# Patient Record
Sex: Female | Born: 1945 | Race: White | Hispanic: No | Marital: Married | Smoking: Current every day smoker
Health system: Southern US, Community
[De-identification: ages and names within clinical notes are randomized; demographics above are authoritative.]

## PROBLEM LIST (undated history)

## (undated) DIAGNOSIS — E7800 Pure hypercholesterolemia, unspecified: Secondary | ICD-10-CM

## (undated) DIAGNOSIS — E119 Type 2 diabetes mellitus without complications: Secondary | ICD-10-CM

## (undated) DIAGNOSIS — K219 Gastro-esophageal reflux disease without esophagitis: Secondary | ICD-10-CM

## (undated) HISTORY — PX: CYST EXCISION: SHX5701

## (undated) HISTORY — PX: MOUTH SURGERY: SHX715

## (undated) HISTORY — PX: ABDOMINAL HYSTERECTOMY: SHX81

---

## 1998-11-08 ENCOUNTER — Other Ambulatory Visit: Admission: RE | Admit: 1998-11-08 | Discharge: 1998-11-08 | Payer: Self-pay | Admitting: Gynecology

## 1998-12-25 ENCOUNTER — Encounter: Payer: Self-pay | Admitting: Gynecology

## 1998-12-25 ENCOUNTER — Ambulatory Visit (HOSPITAL_COMMUNITY): Admission: RE | Admit: 1998-12-25 | Discharge: 1998-12-25 | Payer: Self-pay | Admitting: Gynecology

## 1999-07-25 ENCOUNTER — Encounter: Payer: Self-pay | Admitting: Gynecology

## 1999-07-26 ENCOUNTER — Encounter (INDEPENDENT_AMBULATORY_CARE_PROVIDER_SITE_OTHER): Payer: Self-pay | Admitting: Specialist

## 1999-07-26 ENCOUNTER — Inpatient Hospital Stay (HOSPITAL_COMMUNITY): Admission: RE | Admit: 1999-07-26 | Discharge: 1999-07-27 | Payer: Self-pay | Admitting: Gynecology

## 2001-01-07 ENCOUNTER — Encounter: Payer: Self-pay | Admitting: Obstetrics and Gynecology

## 2001-01-14 ENCOUNTER — Inpatient Hospital Stay (HOSPITAL_COMMUNITY): Admission: RE | Admit: 2001-01-14 | Discharge: 2001-01-15 | Payer: Self-pay | Admitting: Obstetrics and Gynecology

## 2001-01-14 ENCOUNTER — Encounter (INDEPENDENT_AMBULATORY_CARE_PROVIDER_SITE_OTHER): Payer: Self-pay | Admitting: Specialist

## 2003-06-17 ENCOUNTER — Inpatient Hospital Stay (HOSPITAL_COMMUNITY): Admission: EM | Admit: 2003-06-17 | Discharge: 2003-06-20 | Payer: Self-pay | Admitting: Emergency Medicine

## 2003-06-17 ENCOUNTER — Encounter: Payer: Self-pay | Admitting: Emergency Medicine

## 2007-05-21 ENCOUNTER — Emergency Department (HOSPITAL_COMMUNITY): Admission: EM | Admit: 2007-05-21 | Discharge: 2007-05-21 | Payer: Self-pay | Admitting: Emergency Medicine

## 2011-02-22 NOTE — H&P (Signed)
[facility]  Patient:    Angela Bernard, Angela Bernard                       MRN: [id number] Adm. Date:  01/14/01 Attending:  Debbe Bales A. Edward Jolly, M.D.                         History and Physical  CHIEF COMPLAINT:  Recurrent pelvic prolapse.  HISTORY OF PRESENT ILLNESS:  The patient is a 65 year old gravida 5, para 6, Caucasian female, status post total vaginal hysterectomy with modified McCall culdoplasty and anterior colporrhaphy in October of 2000, who presents with a complaint of recurrent prolapse beginning six to eight months following surgery. The patient reports that she feels like she has a balloon in the vagina. She requires digital pressure in the vagina in order to evacuate stool from the rectum. She denies any fecal incontinence. The patient denies stress incontinence, urinary urgency, or urge incontinence. The patient has undergone urodynamic testing which documents the absence of genuine stress incontinence. The patients cystometrogram was stable, and mild sensory urgency was noted. The patient had adequate voiding. The patient wishes for a surgical evaluation and treatment of the recurrent prolapse.  PAST OBSTETRIC AND GYNECOLOGIC HISTORY:  Remarkable for five prior vaginal deliveries. The patients last Pap smear was performed in 2000 and was normal, as had been all of her previous Pap smears. The patient had her last mammogram performed December 02, 2000, and this was noted to be within normal limits. The patient is currently taking hormone replacement therapy, Premarin 0.625 mg p.o. q.d.  PAST MEDICAL HISTORY:  Tobacco abuse:  One pack per day x 34 years.  PAST SURGICAL HISTORY:  1. Status post total vaginal hysterectomy with modified McCall culdoplasty     and anterior colporrhaphy in October of 2000.  2. Status post cyst removal from the right wrist in 1978.  3. Status post teeth extractions in March of 1975.  MEDICATIONS:  Premarin 0.625 mg p.o.  q.d.  ALLERGIES:  No known drug allergies.  SOCIAL HISTORY:  The patient is married. Her work requires lifting. The patient smokes one pack of cigarettes per day. She denies the use of alcohol or IV or addicting drugs.  FAMILY HISTORY:  Positive for breast cancer in a grandmother. The patients father has both hypertension and had a myocardial infarction.  REVIEW OF SYSTEMS:  Please refer to history of present illness.  PHYSICAL EXAMINATION:  GENERAL:  The patient is a middle-aged appearing Caucasian female in no acute distress.  NECK:  Negative for adenopathy and thyromegaly.  LUNGS:  Clear to auscultation bilaterally.  HEART:  S1, S2 with a regular rate and rhythm and no evidence of a murmur, rub or gallop.  ABDOMEN:  Soft and nontender and without evidence of hepatosplenomegaly or organomegaly.  PELVIC:  Normal external genitalia. Normal urethra. There is evidence of a second degree cystocele. There is first degree apical prolapse and a possible enterocele noted. The cervix is absent. There is a second degree rectocele. The bimanual exam documents the absence of midline and adnexal masses. The rectovaginal examination confirms the bimanual exam, and the stool is noted to be guaiac negative.  ASSESSMENT:  The patient is a 65 year old, gravida 5, para 5, Caucasian female, status post total vaginal hysterectomy with modified McCall culdoplasty and anterior colporrhaphy, now with recurrent pelvic organ prolapse. The patient also has a history of significant tobacco abuse.  PLAN:  For  the patient to undergo an anterior and posterior colporrhaphy with possible enterocele repair, ileococcygeus vaginal vault suspension, cystoscopy, and suprapubic catheter placement at [facility] on January 14, 2001. The risks, benefits, and alternatives have been discussed with the patient and she chooses to proceed. DD:  01/13/01 TD:  01/13/01 Job: [id number]

## 2011-02-22 NOTE — Discharge Summary (Signed)
[facility]  Patient:    Angela Bernard, Angela Bernard             MRN: [id number] Adm. Date:  [id number] Disc. Date: [id number] Attending:  Conley Simmonds A                           Discharge Summary  ADMITTING DIAGNOSES: 1. Recurrent pelvic organ prolapse. 2. Tobacco abuse.  DISCHARGE DIAGNOSES: 1. Recurrent pelvic organ prolapse. 2. Status post interior and posterior colporrhaphy, enterocele repair,    iliococcygeus vaginal vault suspension, cystoscopy, suprapubic catheter    placement.  SIGNIFICANT OPERATIONS AND PROCEDURES:  Anterior and posterior colporrhaphy, enterocele repair, iliococcygeus vaginal vault suspension. cystoscopy, suprapubic catheter placement at [facility] on January 14, 2001.  PERTINENT ADMISSION HISTORY AND PHYSICAL EXAMINATION:  The patient was a 65 year old, gravida 5, para 5, Caucasian female, status post total vaginal hysterectomy with modified McCall culdoplasty and anterior colporrhaphy in October 2000, who presented with recurrent pelvic organ prolapse.  The patient required digital pressure in the vagina in order to evacuate stool from the rectum.  She denied any fecal or urinary incontinence.  The patient did have multiple genitourinary dynamic testing performed which documented a stable cystometrogram and no evidence of genuine stress incontinence but a packing placed inside the vagina.  The patient had adequate voiding.  PAST MEDICAL HISTORY:  Tobacco abuse.  The patient smoked 1 package of cigarettes per day for approximately 34 years.  PHYSICAL EXAMINATION:  PELVIC:  Documented normal external genitalia.  The urethra was normal.  There was evidence of a second-degree cystocele, first-degree apical prolapse, and enterocele, and a second-degree rectocele.  The cervix was absent.  There were no midline nor adnexal masses appreciated.  The stool was noted to be guaiac negative.  HOSPITAL COURSE:  The patient  was admitted on January 14, 2001, at which time she underwent an anterior and posterior colporrhaphy with enterocele repair, iliococcygeus vaginal vault suspension, cystoscopy, and suprapubic catheter placement while under general anesthesia.  The surgery was uncomplicated, and the estimated blood loss was 300 cc.  Postoperatively, the patient had an unremarkable course.  On postoperative day #1, the vaginal packing was removed.  The patient had removal of the Foley catheter, and she began bladder draining.  Initially, the patient was able to have spontaneous voids with high residuals, but she developed bladder spasms, and the spontaneous voids diminished to 0 cc with residuals of approximately 200-300 cc.  The patient remained afebrile during her hospital stay.  Her suprapubic catheter site remained without evidence of any erythema or drainage.  The patient was slowly advanced to a regular diet.  Once the PCA and the Toradol were discontinued, she required no further pain medication.  The patient was able to ambulate independently, and she did have TED hose on and compression stockings while lying in bed.  The patients discharge hematocrit was noted to be 33.3%.  The patient was discharged to home in good condition on January 15, 2001.  The patient expressed interest in trying Zyban to assist in smoking cessation. The patient was given a prescription for Zyban 150 mg p.o. q.d. x 3 days, then 150 mg p.o. b.i.d. x 12 weeks.  The patient was also given a prescription for Percocet 1-2 p.o. q.4-6h. p.r.n. pain.  The patient will have decreased activity for the next three months.  She will undergo bladder rest for the next two days with the suprapubic catheter  placed to drainage.  She will then begin bladder training again.  The patient will call the office in four days to report the results of her bladder training.  She will call if she experiences fever, increased pain, increased bleeding,  painful urination, or blocked suprapubic catheter or any other concern. DD:  01/15/01 TD:  01/16/01 Job: [id number]

## 2011-02-22 NOTE — Op Note (Signed)
[facility]  Patient:    Angela Bernard, Angela Bernard                       MRN: [id number] Proc. Date: 01/14/01 Attending:  Debbe Bales A. Edward Jolly, M.D.                           Operative Report  PREOPERATIVE DIAGNOSIS:  Recurrent pelvic organ prolapse.  POSTOPERATIVE DIAGNOSIS:  Recurrent pelvic organ prolapse.  PROCEDURE:  Anterior and posterior colporrhaphy, iliococcygeus vaginal vault suspension, enterocele repair, cystoscopy with suprapubic catheter placement.  SURGEON:  Brook A. Edward Jolly, M.D.  ASSISTANT:  Miguel Aschoff, M.D.  ANESTHESIA:  General endotracheal.  IV FLUIDS:  2700 cc Ringers Lactate.  ESTIMATED BLOOD LOSS:  300 cc.  URINE OUTPUT:  150 cc.  COMPLICATIONS:  None.  INDICATIONS FOR PROCEDURE:  The patient was a 65 year old, gravida 5, para 45, Caucasian female, status post total vaginal hysterectomy with modified McCall culdoplasty and anterior colporrhaphy in October 2000, who presented with recurrent prolapse of the vagina and a wish for surgical evaluation and treatment.  The patient reported the need for digital pressure in the vagina in order to evacuate stool from the rectum.  She denied any fecal incontinence and urinary incontinence.  The patient did undergo multichannel urodynamic testing prior to her surgery, and there was no evidence of genuine stress incontinence.  The patient chose to proceed with surgical evaluation and treatment of the prolapse after the risks, benefits, and alternatives were discussed with her.  FINDINGS:  Examination under anesthesia revealed an absent cervix and uterus. No adnexal masses were appreciated.  There was evidence of a second-degree cystocele, first-degree apical prolapse, a large enterocele, and a second-degree rectocele.  Cystoscopy at the end of the procedure documented the ureters to be patent bilaterally.  The urethra and bladder were without evidence of any sutures. The trigone region was noted to  be unremarkable, and the bladder was visualized throughout the entire 360 degrees.  PROCEDURE:  With an IV in place and compression stockings and TED hose on, the patient was taken to the operating room after she was properly identified. The patient did receive Ancef 1 gram IV as antibiotic prophylaxis prior to beginning the procedure.  The patient was placed in the supine position, and general endotracheal anesthesia was induced.  The patient was then placed in the dorsal lithotomy position, and the vagina and perineum were sterilely prepped and draped.  A Foley catheter was sterilely placed inside the bladder, and an examination under anesthesia was performed, and the findings are as noted above.  The procedure began by marking the vaginal apices bilaterally with a figure-of-eight suture of Vicryl.  Allis clamps were then used to mark the hymen and perineal body and the midline of the vagina up to the level of the vaginal apex.  The vaginal mucosa was then injected with 1% lidocaine with 1:200,000 of epinephrine for blanching of the tissue.  A triangular specimen of skin was then excised from the patients perineum.  The vagina was incised in the midline with the Mayo scissors up to the level of the vaginal apex. The perirectal fascia was then dissected off of the overlying vaginal mucosa using sharp dissection with the Mayo scissors.  This was performed bilaterally.  During this process, the enterocele sac was entered sharply with a scissors.  Digital exam confirmed the presence of the enterocele sac.  The enterocele sac was  then dissected off of the surrounding rectum and the bladder with sharp dissection using a Metzenbaum scissors.  The enterocele sac was then reduced by placing two figure-of-eight sutures of 2-0 Ethibond, and the excess enterocele sac was excised and sent to pathology.  The anterior vagina was next opened.  Allis clamps were used to mark the midline of the vagina,  and the vaginal tissue was injected with 1% lidocaine with 1:200,000 of epinephrine.  This blanched the skin well.  The vagina was then incised vertically using a Mayo scissors, and the vaginal mucosa was dissected off of the subvaginal tissue overlying the bladder using a Mayo scissors bilaterally.  A Kelly-Kennedy plication suture using 2-0 PDS was next placed.  The cystocele was reduced by placing vertical mattress sutures of 0 PDS.  The cystocele sutures were sequentially tied with a Foley catheter in the patients urethra.  Next the iliococcygeus vaginal vault suspension was performed.  A suture of 0 PDS was brought through the vaginal apex on the patients left-hand side.  It was then brought through the iliococcygeus fascia and musculature on the ipsilateral side and was brought out through the vaginal mucosa on that same side.  The suture was then held and was tied at the end of the surgery.  The same procedure that was performed on the left-hand side was then repeated on the patients right-hand side.  The posterior colporrhaphy was finished at this time by placing vertical mattress sutures of 0 PDS through the perirectal fascia.  These were sequentially tied and adequately reduced the rectocele.  A crown stitch of 0 Vicryl was placed along the patients perineum.  A rectal examination at this time confirmed the absence of any sutures in the rectum.  The excess vaginal mucosa was then trimmed bilaterally with the Metzenbaum scissors.  The vagina was closed by placing a running lock suture of 2-0 Vicryl.  The iliococcygeus sutures were next tied, and there was excellent elevation of the vaginal apex bilaterally.  Cystoscopy was next performed, and the findings are as noted above.  A suprapubic catheter was placed under direct visualization of the cystoscope while the patient was in the Trendelenburg position.  The suprapubic catheter was secured to the skin using 2-0  Prolene.  The procedure was terminated with the closure of the perineum which was performed in standard fashion as for an episiotomy with a 2-0 Vicryl suture.  Hemostasis was noted to be excellent at this time.  A vaginal packing of Iodoform gauze was placed.  The patient was cleansed of any remaining Betadine and was taken out of the dorsal lithotomy position.  Both the Foley catheter and the suprapubic catheter were in place and were left to drainage.  All needle, instrument, and sponge counts were correct.  The patient was escorted to the recovery room in stable and awake condition.  COMPLICATIONS:  None. DD:  01/14/01 TD:  01/14/01 Job: [id number]

## 2011-03-03 ENCOUNTER — Emergency Department (HOSPITAL_COMMUNITY): Payer: Self-pay

## 2011-03-03 ENCOUNTER — Emergency Department (HOSPITAL_COMMUNITY)
Admission: EM | Admit: 2011-03-03 | Discharge: 2011-03-03 | Disposition: A | Discharge status: Home / Self Care | Payer: Self-pay | Attending: Emergency Medicine | Admitting: Emergency Medicine

## 2011-03-03 DIAGNOSIS — I10 Essential (primary) hypertension: Secondary | ICD-10-CM | POA: Insufficient documentation

## 2011-03-03 DIAGNOSIS — E7800 Pure hypercholesterolemia, unspecified: Secondary | ICD-10-CM | POA: Insufficient documentation

## 2011-03-03 DIAGNOSIS — G510 Bell's palsy: Secondary | ICD-10-CM | POA: Insufficient documentation

## 2011-03-03 DIAGNOSIS — N390 Urinary tract infection, site not specified: Secondary | ICD-10-CM | POA: Insufficient documentation

## 2011-03-03 DIAGNOSIS — R29810 Facial weakness: Secondary | ICD-10-CM | POA: Insufficient documentation

## 2011-03-03 DIAGNOSIS — E119 Type 2 diabetes mellitus without complications: Secondary | ICD-10-CM | POA: Insufficient documentation

## 2011-03-03 LAB — URINALYSIS, ROUTINE W REFLEX MICROSCOPIC
Bilirubin Urine: NEGATIVE
Nitrite: POSITIVE — AB
Protein, ur: NEGATIVE mg/dL
Specific Gravity, Urine: 1.016 (ref 1.005–1.030)
Urobilinogen, UA: 0.2 mg/dL (ref 0.0–1.0)

## 2011-03-03 LAB — DIFFERENTIAL
Basophils Absolute: 0 10*3/uL (ref 0.0–0.1)
Basophils Relative: 0 % (ref 0–1)
Eosinophils Absolute: 0.2 10*3/uL (ref 0.0–0.7)
Eosinophils Relative: 2 % (ref 0–5)
Monocytes Absolute: 0.5 10*3/uL (ref 0.1–1.0)
Monocytes Relative: 5 % (ref 3–12)

## 2011-03-03 LAB — BASIC METABOLIC PANEL
CO2: 23 mEq/L (ref 19–32)
Calcium: 8.7 mg/dL (ref 8.4–10.5)
Creatinine, Ser: 0.84 mg/dL (ref 0.4–1.2)
GFR calc Af Amer: >60 mL/min (ref >60)
GFR calc non Af Amer: >60 mL/min (ref >60)
Glucose, Bld: 124 mg/dL — ABNORMAL HIGH (ref 70–99)
Sodium: 140 mEq/L (ref 135–145)

## 2011-03-03 LAB — CBC
MCH: 26.7 pg (ref 26.0–34.0)
MCHC: 32.2 g/dL (ref 30.0–36.0)
RDW: 14.6 % (ref 11.5–15.5)

## 2011-03-03 LAB — URINE MICROSCOPIC-ADD ON

## 2011-03-05 LAB — URINE CULTURE: Colony Count: >=100000

## 2011-07-22 LAB — BASIC METABOLIC PANEL
CO2: 23
Chloride: 104
GFR calc Af Amer: >60
Glucose, Bld: 201 — ABNORMAL HIGH
Potassium: 3.5
Sodium: 137

## 2011-07-22 LAB — DIFFERENTIAL
Basophils Relative: 0
Eosinophils Absolute: 0.1
Eosinophils Relative: 1
Monocytes Absolute: 0.4
Monocytes Relative: 5
Neutro Abs: 4.3

## 2011-07-22 LAB — POCT CARDIAC MARKERS: Myoglobin, poc: 74.9

## 2011-07-22 LAB — CBC
HCT: 34.6 — ABNORMAL LOW
Hemoglobin: 12
MCHC: 34.6
MCV: 85.7
RBC: 4.03
RDW: 14.1 — ABNORMAL HIGH

## 2013-03-04 ENCOUNTER — Encounter (INDEPENDENT_AMBULATORY_CARE_PROVIDER_SITE_OTHER): Payer: No Typology Code available for payment source | Admitting: Ophthalmology

## 2013-03-04 DIAGNOSIS — E11319 Type 2 diabetes mellitus with unspecified diabetic retinopathy without macular edema: Secondary | ICD-10-CM

## 2013-03-04 DIAGNOSIS — E1165 Type 2 diabetes mellitus with hyperglycemia: Secondary | ICD-10-CM

## 2013-03-04 DIAGNOSIS — H43819 Vitreous degeneration, unspecified eye: Secondary | ICD-10-CM

## 2013-03-04 DIAGNOSIS — H2510 Age-related nuclear cataract, unspecified eye: Secondary | ICD-10-CM

## 2014-02-17 ENCOUNTER — Other Ambulatory Visit: Payer: Self-pay | Admitting: Gastroenterology

## 2014-02-17 DIAGNOSIS — R112 Nausea with vomiting, unspecified: Secondary | ICD-10-CM

## 2014-02-18 ENCOUNTER — Ambulatory Visit
Admission: RE | Admit: 2014-02-18 | Discharge: 2014-02-18 | Disposition: A | Discharge status: Home / Self Care | Payer: Commercial Managed Care - HMO | Source: Ambulatory Visit | Attending: Gastroenterology | Admitting: Gastroenterology

## 2014-02-18 DIAGNOSIS — R112 Nausea with vomiting, unspecified: Secondary | ICD-10-CM

## 2014-03-04 ENCOUNTER — Ambulatory Visit (INDEPENDENT_AMBULATORY_CARE_PROVIDER_SITE_OTHER): Payer: No Typology Code available for payment source | Admitting: Ophthalmology

## 2015-01-25 DIAGNOSIS — Z0000 Encounter for general adult medical examination without abnormal findings: Secondary | ICD-10-CM | POA: Diagnosis not present

## 2015-01-25 DIAGNOSIS — E781 Pure hyperglyceridemia: Secondary | ICD-10-CM | POA: Diagnosis not present

## 2015-01-25 DIAGNOSIS — F17200 Nicotine dependence, unspecified, uncomplicated: Secondary | ICD-10-CM | POA: Diagnosis not present

## 2015-01-25 DIAGNOSIS — D649 Anemia, unspecified: Secondary | ICD-10-CM | POA: Diagnosis not present

## 2015-01-25 DIAGNOSIS — E785 Hyperlipidemia, unspecified: Secondary | ICD-10-CM | POA: Diagnosis not present

## 2015-01-25 DIAGNOSIS — I10 Essential (primary) hypertension: Secondary | ICD-10-CM | POA: Diagnosis not present

## 2015-01-25 DIAGNOSIS — E1121 Type 2 diabetes mellitus with diabetic nephropathy: Secondary | ICD-10-CM | POA: Diagnosis not present

## 2015-01-25 DIAGNOSIS — Z23 Encounter for immunization: Secondary | ICD-10-CM | POA: Diagnosis not present

## 2015-03-15 DIAGNOSIS — M8589 Other specified disorders of bone density and structure, multiple sites: Secondary | ICD-10-CM | POA: Diagnosis not present

## 2015-03-15 DIAGNOSIS — M899 Disorder of bone, unspecified: Secondary | ICD-10-CM | POA: Diagnosis not present

## 2015-03-27 DIAGNOSIS — H40013 Open angle with borderline findings, low risk, bilateral: Secondary | ICD-10-CM | POA: Diagnosis not present

## 2015-04-12 DIAGNOSIS — K219 Gastro-esophageal reflux disease without esophagitis: Secondary | ICD-10-CM | POA: Diagnosis not present

## 2015-04-12 DIAGNOSIS — K2950 Unspecified chronic gastritis without bleeding: Secondary | ICD-10-CM | POA: Diagnosis not present

## 2015-04-12 DIAGNOSIS — R12 Heartburn: Secondary | ICD-10-CM | POA: Diagnosis not present

## 2015-04-12 DIAGNOSIS — R1013 Epigastric pain: Secondary | ICD-10-CM | POA: Diagnosis not present

## 2015-05-24 DIAGNOSIS — K259 Gastric ulcer, unspecified as acute or chronic, without hemorrhage or perforation: Secondary | ICD-10-CM | POA: Diagnosis not present

## 2015-05-24 DIAGNOSIS — K219 Gastro-esophageal reflux disease without esophagitis: Secondary | ICD-10-CM | POA: Diagnosis not present

## 2015-06-04 DIAGNOSIS — N390 Urinary tract infection, site not specified: Secondary | ICD-10-CM | POA: Diagnosis not present

## 2015-08-07 DIAGNOSIS — I10 Essential (primary) hypertension: Secondary | ICD-10-CM | POA: Diagnosis not present

## 2015-08-07 DIAGNOSIS — E1121 Type 2 diabetes mellitus with diabetic nephropathy: Secondary | ICD-10-CM | POA: Diagnosis not present

## 2015-08-07 DIAGNOSIS — K219 Gastro-esophageal reflux disease without esophagitis: Secondary | ICD-10-CM | POA: Diagnosis not present

## 2015-08-07 DIAGNOSIS — Z23 Encounter for immunization: Secondary | ICD-10-CM | POA: Diagnosis not present

## 2015-08-07 DIAGNOSIS — E781 Pure hyperglyceridemia: Secondary | ICD-10-CM | POA: Diagnosis not present

## 2015-08-07 DIAGNOSIS — E782 Mixed hyperlipidemia: Secondary | ICD-10-CM | POA: Diagnosis not present

## 2015-08-07 DIAGNOSIS — F17200 Nicotine dependence, unspecified, uncomplicated: Secondary | ICD-10-CM | POA: Diagnosis not present

## 2015-11-06 DIAGNOSIS — E119 Type 2 diabetes mellitus without complications: Secondary | ICD-10-CM | POA: Diagnosis not present

## 2015-11-06 DIAGNOSIS — H40023 Open angle with borderline findings, high risk, bilateral: Secondary | ICD-10-CM | POA: Diagnosis not present

## 2016-04-03 DIAGNOSIS — D649 Anemia, unspecified: Secondary | ICD-10-CM | POA: Diagnosis not present

## 2016-04-03 DIAGNOSIS — K219 Gastro-esophageal reflux disease without esophagitis: Secondary | ICD-10-CM | POA: Diagnosis not present

## 2016-04-03 DIAGNOSIS — I10 Essential (primary) hypertension: Secondary | ICD-10-CM | POA: Diagnosis not present

## 2016-04-03 DIAGNOSIS — Z7984 Long term (current) use of oral hypoglycemic drugs: Secondary | ICD-10-CM | POA: Diagnosis not present

## 2016-04-03 DIAGNOSIS — E1121 Type 2 diabetes mellitus with diabetic nephropathy: Secondary | ICD-10-CM | POA: Diagnosis not present

## 2016-04-03 DIAGNOSIS — E782 Mixed hyperlipidemia: Secondary | ICD-10-CM | POA: Diagnosis not present

## 2016-04-03 DIAGNOSIS — Z0000 Encounter for general adult medical examination without abnormal findings: Secondary | ICD-10-CM | POA: Diagnosis not present

## 2016-04-03 DIAGNOSIS — F17200 Nicotine dependence, unspecified, uncomplicated: Secondary | ICD-10-CM | POA: Diagnosis not present

## 2016-07-02 DIAGNOSIS — Z23 Encounter for immunization: Secondary | ICD-10-CM | POA: Diagnosis not present

## 2016-07-30 DIAGNOSIS — Z803 Family history of malignant neoplasm of breast: Secondary | ICD-10-CM | POA: Diagnosis not present

## 2016-07-30 DIAGNOSIS — Z1231 Encounter for screening mammogram for malignant neoplasm of breast: Secondary | ICD-10-CM | POA: Diagnosis not present

## 2016-10-01 DIAGNOSIS — E1121 Type 2 diabetes mellitus with diabetic nephropathy: Secondary | ICD-10-CM | POA: Diagnosis not present

## 2016-10-01 DIAGNOSIS — Z7984 Long term (current) use of oral hypoglycemic drugs: Secondary | ICD-10-CM | POA: Diagnosis not present

## 2016-10-01 DIAGNOSIS — F17200 Nicotine dependence, unspecified, uncomplicated: Secondary | ICD-10-CM | POA: Diagnosis not present

## 2016-10-01 DIAGNOSIS — E782 Mixed hyperlipidemia: Secondary | ICD-10-CM | POA: Diagnosis not present

## 2016-10-01 DIAGNOSIS — I10 Essential (primary) hypertension: Secondary | ICD-10-CM | POA: Diagnosis not present

## 2016-10-14 DIAGNOSIS — S62637A Displaced fracture of distal phalanx of left little finger, initial encounter for closed fracture: Secondary | ICD-10-CM | POA: Diagnosis not present

## 2016-10-14 DIAGNOSIS — M20012 Mallet finger of left finger(s): Secondary | ICD-10-CM | POA: Diagnosis not present

## 2016-10-14 DIAGNOSIS — M79645 Pain in left finger(s): Secondary | ICD-10-CM | POA: Diagnosis not present

## 2016-10-14 DIAGNOSIS — S62639A Displaced fracture of distal phalanx of unspecified finger, initial encounter for closed fracture: Secondary | ICD-10-CM | POA: Diagnosis not present

## 2016-10-14 DIAGNOSIS — M20019 Mallet finger of unspecified finger(s): Secondary | ICD-10-CM | POA: Diagnosis not present

## 2016-10-14 DIAGNOSIS — M79642 Pain in left hand: Secondary | ICD-10-CM | POA: Diagnosis not present

## 2016-10-14 DIAGNOSIS — R52 Pain, unspecified: Secondary | ICD-10-CM | POA: Diagnosis not present

## 2016-11-11 DIAGNOSIS — M20019 Mallet finger of unspecified finger(s): Secondary | ICD-10-CM | POA: Diagnosis not present

## 2016-11-11 DIAGNOSIS — M20012 Mallet finger of left finger(s): Secondary | ICD-10-CM | POA: Diagnosis not present

## 2016-11-11 DIAGNOSIS — S62639A Displaced fracture of distal phalanx of unspecified finger, initial encounter for closed fracture: Secondary | ICD-10-CM | POA: Diagnosis not present

## 2016-11-11 DIAGNOSIS — S62637A Displaced fracture of distal phalanx of left little finger, initial encounter for closed fracture: Secondary | ICD-10-CM | POA: Diagnosis not present

## 2016-12-09 DIAGNOSIS — S62635D Displaced fracture of distal phalanx of left ring finger, subsequent encounter for fracture with routine healing: Secondary | ICD-10-CM | POA: Diagnosis not present

## 2016-12-09 DIAGNOSIS — S62639D Displaced fracture of distal phalanx of unspecified finger, subsequent encounter for fracture with routine healing: Secondary | ICD-10-CM | POA: Diagnosis not present

## 2016-12-09 DIAGNOSIS — M20019 Mallet finger of unspecified finger(s): Secondary | ICD-10-CM | POA: Diagnosis not present

## 2016-12-09 DIAGNOSIS — S62637D Displaced fracture of distal phalanx of left little finger, subsequent encounter for fracture with routine healing: Secondary | ICD-10-CM | POA: Diagnosis not present

## 2016-12-09 DIAGNOSIS — M25649 Stiffness of unspecified hand, not elsewhere classified: Secondary | ICD-10-CM | POA: Diagnosis not present

## 2017-01-06 DIAGNOSIS — S62639D Displaced fracture of distal phalanx of unspecified finger, subsequent encounter for fracture with routine healing: Secondary | ICD-10-CM | POA: Diagnosis not present

## 2017-04-15 DIAGNOSIS — Z7984 Long term (current) use of oral hypoglycemic drugs: Secondary | ICD-10-CM | POA: Diagnosis not present

## 2017-04-15 DIAGNOSIS — E781 Pure hyperglyceridemia: Secondary | ICD-10-CM | POA: Diagnosis not present

## 2017-04-15 DIAGNOSIS — D649 Anemia, unspecified: Secondary | ICD-10-CM | POA: Diagnosis not present

## 2017-04-15 DIAGNOSIS — E782 Mixed hyperlipidemia: Secondary | ICD-10-CM | POA: Diagnosis not present

## 2017-04-15 DIAGNOSIS — I10 Essential (primary) hypertension: Secondary | ICD-10-CM | POA: Diagnosis not present

## 2017-04-15 DIAGNOSIS — Z0000 Encounter for general adult medical examination without abnormal findings: Secondary | ICD-10-CM | POA: Diagnosis not present

## 2017-04-15 DIAGNOSIS — E1121 Type 2 diabetes mellitus with diabetic nephropathy: Secondary | ICD-10-CM | POA: Diagnosis not present

## 2017-04-30 DIAGNOSIS — E119 Type 2 diabetes mellitus without complications: Secondary | ICD-10-CM | POA: Diagnosis not present

## 2017-04-30 DIAGNOSIS — H40023 Open angle with borderline findings, high risk, bilateral: Secondary | ICD-10-CM | POA: Diagnosis not present

## 2017-06-02 DIAGNOSIS — H40023 Open angle with borderline findings, high risk, bilateral: Secondary | ICD-10-CM | POA: Diagnosis not present

## 2017-06-02 DIAGNOSIS — H40013 Open angle with borderline findings, low risk, bilateral: Secondary | ICD-10-CM | POA: Diagnosis not present

## 2017-07-07 DIAGNOSIS — Z23 Encounter for immunization: Secondary | ICD-10-CM | POA: Diagnosis not present

## 2017-08-04 DIAGNOSIS — Z1231 Encounter for screening mammogram for malignant neoplasm of breast: Secondary | ICD-10-CM | POA: Diagnosis not present

## 2017-08-25 DIAGNOSIS — M65312 Trigger thumb, left thumb: Secondary | ICD-10-CM | POA: Diagnosis not present

## 2017-09-29 DIAGNOSIS — M65312 Trigger thumb, left thumb: Secondary | ICD-10-CM | POA: Diagnosis not present

## 2017-10-08 DIAGNOSIS — H16143 Punctate keratitis, bilateral: Secondary | ICD-10-CM | POA: Diagnosis not present

## 2017-10-08 DIAGNOSIS — H40053 Ocular hypertension, bilateral: Secondary | ICD-10-CM | POA: Diagnosis not present

## 2017-10-08 DIAGNOSIS — H40023 Open angle with borderline findings, high risk, bilateral: Secondary | ICD-10-CM | POA: Diagnosis not present

## 2017-10-08 DIAGNOSIS — H18893 Other specified disorders of cornea, bilateral: Secondary | ICD-10-CM | POA: Diagnosis not present

## 2018-01-07 DIAGNOSIS — M65312 Trigger thumb, left thumb: Secondary | ICD-10-CM | POA: Diagnosis not present

## 2018-01-08 ENCOUNTER — Other Ambulatory Visit: Payer: Self-pay | Admitting: Orthopedic Surgery

## 2018-01-08 ENCOUNTER — Other Ambulatory Visit: Payer: Self-pay

## 2018-01-08 ENCOUNTER — Encounter (HOSPITAL_BASED_OUTPATIENT_CLINIC_OR_DEPARTMENT_OTHER): Payer: Self-pay | Admitting: *Deleted

## 2018-01-12 ENCOUNTER — Encounter (HOSPITAL_BASED_OUTPATIENT_CLINIC_OR_DEPARTMENT_OTHER)
Admission: RE | Admit: 2018-01-12 | Discharge: 2018-01-12 | Disposition: A | Discharge status: Home / Self Care | Payer: Medicare HMO | Source: Ambulatory Visit | Attending: Orthopedic Surgery | Admitting: Orthopedic Surgery

## 2018-01-12 DIAGNOSIS — F17200 Nicotine dependence, unspecified, uncomplicated: Secondary | ICD-10-CM | POA: Diagnosis not present

## 2018-01-12 DIAGNOSIS — Z881 Allergy status to other antibiotic agents status: Secondary | ICD-10-CM | POA: Diagnosis not present

## 2018-01-12 DIAGNOSIS — Z90710 Acquired absence of both cervix and uterus: Secondary | ICD-10-CM | POA: Diagnosis not present

## 2018-01-12 DIAGNOSIS — M65312 Trigger thumb, left thumb: Secondary | ICD-10-CM | POA: Diagnosis not present

## 2018-01-12 DIAGNOSIS — E119 Type 2 diabetes mellitus without complications: Secondary | ICD-10-CM | POA: Diagnosis not present

## 2018-01-12 DIAGNOSIS — K219 Gastro-esophageal reflux disease without esophagitis: Secondary | ICD-10-CM | POA: Diagnosis not present

## 2018-01-12 DIAGNOSIS — E7800 Pure hypercholesterolemia, unspecified: Secondary | ICD-10-CM | POA: Diagnosis not present

## 2018-01-12 LAB — BASIC METABOLIC PANEL
Anion gap: 12 (ref 5–15)
BUN: 15 mg/dL (ref 6–20)
CHLORIDE: 103 mmol/L (ref 101–111)
CO2: 21 mmol/L — AB (ref 22–32)
CREATININE: 0.95 mg/dL (ref 0.44–1.00)
Calcium: 9 mg/dL (ref 8.9–10.3)
GFR calc Af Amer: >60 mL/min (ref >60)
GFR calc non Af Amer: 60 mL/min — ABNORMAL LOW (ref >60)
Glucose, Bld: 309 mg/dL — ABNORMAL HIGH (ref 65–99)
Potassium: 4.5 mmol/L (ref 3.5–5.1)
SODIUM: 136 mmol/L (ref 135–145)

## 2018-01-12 NOTE — Progress Notes (Signed)
EKG reviewed by Dr. Turk, will proceed with surgery as scheduled. 

## 2018-01-13 ENCOUNTER — Ambulatory Visit (HOSPITAL_BASED_OUTPATIENT_CLINIC_OR_DEPARTMENT_OTHER): Payer: Medicare HMO | Admitting: Anesthesiology

## 2018-01-13 ENCOUNTER — Encounter (HOSPITAL_BASED_OUTPATIENT_CLINIC_OR_DEPARTMENT_OTHER): Payer: Self-pay | Admitting: Anesthesiology

## 2018-01-13 ENCOUNTER — Ambulatory Visit (HOSPITAL_BASED_OUTPATIENT_CLINIC_OR_DEPARTMENT_OTHER)
Admission: RE | Admit: 2018-01-13 | Discharge: 2018-01-13 | Disposition: A | Discharge status: Home / Self Care | Payer: Medicare HMO | Source: Ambulatory Visit | Attending: Orthopedic Surgery | Admitting: Orthopedic Surgery

## 2018-01-13 ENCOUNTER — Encounter (HOSPITAL_BASED_OUTPATIENT_CLINIC_OR_DEPARTMENT_OTHER)
Admission: RE | Disposition: A | Discharge status: Home / Self Care | Payer: Self-pay | Source: Ambulatory Visit | Attending: Orthopedic Surgery

## 2018-01-13 DIAGNOSIS — F17200 Nicotine dependence, unspecified, uncomplicated: Secondary | ICD-10-CM | POA: Insufficient documentation

## 2018-01-13 DIAGNOSIS — K219 Gastro-esophageal reflux disease without esophagitis: Secondary | ICD-10-CM | POA: Insufficient documentation

## 2018-01-13 DIAGNOSIS — E119 Type 2 diabetes mellitus without complications: Secondary | ICD-10-CM | POA: Diagnosis not present

## 2018-01-13 DIAGNOSIS — Z90710 Acquired absence of both cervix and uterus: Secondary | ICD-10-CM | POA: Diagnosis not present

## 2018-01-13 DIAGNOSIS — Z881 Allergy status to other antibiotic agents status: Secondary | ICD-10-CM | POA: Diagnosis not present

## 2018-01-13 DIAGNOSIS — E7800 Pure hypercholesterolemia, unspecified: Secondary | ICD-10-CM | POA: Diagnosis not present

## 2018-01-13 DIAGNOSIS — M65842 Other synovitis and tenosynovitis, left hand: Secondary | ICD-10-CM | POA: Diagnosis not present

## 2018-01-13 DIAGNOSIS — M65312 Trigger thumb, left thumb: Secondary | ICD-10-CM | POA: Diagnosis not present

## 2018-01-13 HISTORY — PX: TRIGGER FINGER RELEASE: SHX641

## 2018-01-13 HISTORY — DX: Gastro-esophageal reflux disease without esophagitis: K21.9

## 2018-01-13 HISTORY — DX: Pure hypercholesterolemia, unspecified: E78.00

## 2018-01-13 HISTORY — DX: Type 2 diabetes mellitus without complications: E11.9

## 2018-01-13 LAB — GLUCOSE, CAPILLARY: Glucose-Capillary: 137 mg/dL — ABNORMAL HIGH (ref 65–99)

## 2018-01-13 SURGERY — RELEASE, A1 PULLEY, FOR TRIGGER FINGER
Anesthesia: Regional | Site: Hand | Laterality: Left

## 2018-01-13 MED ORDER — CHLORHEXIDINE GLUCONATE 4 % EX LIQD: 60.0000 mL | Freq: Once | CUTANEOUS | Status: DC

## 2018-01-13 MED ORDER — FENTANYL CITRATE (PF) 100 MCG/2ML IJ SOLN
INTRAMUSCULAR | Status: AC
Start: 2018-01-13 — End: 2018-01-13
  Filled 2018-01-13: qty 2

## 2018-01-13 MED ORDER — FENTANYL CITRATE (PF) 100 MCG/2ML IJ SOLN
50.0000 ug | INTRAMUSCULAR | Status: DC | PRN
  Administered 2018-01-13: 50 ug via INTRAVENOUS

## 2018-01-13 MED ORDER — LIDOCAINE HCL (CARDIAC) 20 MG/ML IV SOLN
INTRAVENOUS | Status: AC
  Filled 2018-01-13: qty 15

## 2018-01-13 MED ORDER — ONDANSETRON HCL 4 MG/2ML IJ SOLN
INTRAMUSCULAR | Status: AC
  Filled 2018-01-13: qty 10

## 2018-01-13 MED ORDER — CEFAZOLIN SODIUM-DEXTROSE 2-4 GM/100ML-% IV SOLN
2.0000 g | INTRAVENOUS | Status: AC
  Administered 2018-01-13: 2 g via INTRAVENOUS

## 2018-01-13 MED ORDER — OXYCODONE HCL 5 MG PO TABS: 5.0000 mg | ORAL_TABLET | Freq: Once | ORAL | Status: DC | PRN

## 2018-01-13 MED ORDER — PROPOFOL 500 MG/50ML IV EMUL
INTRAVENOUS | Status: DC | PRN
  Administered 2018-01-13: 25 ug/kg/min via INTRAVENOUS

## 2018-01-13 MED ORDER — ONDANSETRON HCL 4 MG/2ML IJ SOLN: 4.0000 mg | Freq: Once | INTRAMUSCULAR | Status: DC | PRN

## 2018-01-13 MED ORDER — BUPIVACAINE HCL (PF) 0.25 % IJ SOLN
INTRAMUSCULAR | Status: DC | PRN
  Administered 2018-01-13: 5 mL

## 2018-01-13 MED ORDER — LACTATED RINGERS IV SOLN
INTRAVENOUS | Status: DC
  Administered 2018-01-13: 12:00:00 via INTRAVENOUS

## 2018-01-13 MED ORDER — LIDOCAINE HCL (CARDIAC) 20 MG/ML IV SOLN
INTRAVENOUS | Status: DC | PRN
  Administered 2018-01-13: 30 mg via INTRAVENOUS

## 2018-01-13 MED ORDER — MIDAZOLAM HCL 2 MG/2ML IJ SOLN: 1.0000 mg | INTRAMUSCULAR | Status: DC | PRN

## 2018-01-13 MED ORDER — SCOPOLAMINE 1 MG/3DAYS TD PT72: 1.0000 | MEDICATED_PATCH | Freq: Once | TRANSDERMAL | Status: DC | PRN

## 2018-01-13 MED ORDER — 0.9 % SODIUM CHLORIDE (POUR BTL) OPTIME
TOPICAL | Status: DC | PRN
  Administered 2018-01-13: 100 mL

## 2018-01-13 MED ORDER — OXYCODONE HCL 5 MG/5ML PO SOLN: 5.0000 mg | Freq: Once | ORAL | Status: DC | PRN

## 2018-01-13 MED ORDER — CEFAZOLIN SODIUM-DEXTROSE 2-4 GM/100ML-% IV SOLN
INTRAVENOUS | Status: AC
  Filled 2018-01-13: qty 100

## 2018-01-13 MED ORDER — HYDROCODONE-ACETAMINOPHEN 5-325 MG PO TABS: 1.0000 | ORAL_TABLET | Freq: Four times a day (QID) | ORAL | 0 refills | Status: DC | PRN

## 2018-01-13 MED ORDER — LIDOCAINE HCL (PF) 0.5 % IJ SOLN
INTRAMUSCULAR | Status: DC | PRN
  Administered 2018-01-13: 30 mL via INTRAVENOUS

## 2018-01-13 MED ORDER — FENTANYL CITRATE (PF) 100 MCG/2ML IJ SOLN: 25.0000 ug | INTRAMUSCULAR | Status: DC | PRN

## 2018-01-13 MED ORDER — DEXAMETHASONE SODIUM PHOSPHATE 10 MG/ML IJ SOLN
INTRAMUSCULAR | Status: AC
  Filled 2018-01-13: qty 3

## 2018-01-13 SURGICAL SUPPLY — 36 items
BANDAGE COBAN STERILE 2 (GAUZE/BANDAGES/DRESSINGS) ×3 IMPLANT
BLADE SURG 15 STRL LF DISP TIS (BLADE) ×1 IMPLANT
BLADE SURG 15 STRL SS (BLADE) ×2
BNDG ESMARK 4X9 LF (GAUZE/BANDAGES/DRESSINGS) ×3 IMPLANT
CHLORAPREP W/TINT 26ML (MISCELLANEOUS) ×3 IMPLANT
CORD BIPOLAR FORCEPS 12FT (ELECTRODE) IMPLANT
COVER BACK TABLE 60X90IN (DRAPES) ×3 IMPLANT
COVER MAYO STAND STRL (DRAPES) ×3 IMPLANT
CUFF TOURNIQUET SINGLE 18IN (TOURNIQUET CUFF) ×3 IMPLANT
DECANTER SPIKE VIAL GLASS SM (MISCELLANEOUS) IMPLANT
DRAPE EXTREMITY T 121X128X90 (DRAPE) ×3 IMPLANT
DRAPE SURG 17X23 STRL (DRAPES) ×3 IMPLANT
GAUZE SPONGE 4X4 12PLY STRL (GAUZE/BANDAGES/DRESSINGS) ×3 IMPLANT
GAUZE XEROFORM 1X8 LF (GAUZE/BANDAGES/DRESSINGS) ×3 IMPLANT
GLOVE BIOGEL PI IND STRL 7.0 (GLOVE) ×2 IMPLANT
GLOVE BIOGEL PI IND STRL 7.5 (GLOVE) ×1 IMPLANT
GLOVE BIOGEL PI IND STRL 8.5 (GLOVE) ×1 IMPLANT
GLOVE BIOGEL PI INDICATOR 7.0 (GLOVE) ×4
GLOVE BIOGEL PI INDICATOR 7.5 (GLOVE) ×2
GLOVE BIOGEL PI INDICATOR 8.5 (GLOVE) ×2
GLOVE ECLIPSE 6.5 STRL STRAW (GLOVE) ×3 IMPLANT
GLOVE SURG ORTHO 8.0 STRL STRW (GLOVE) ×3 IMPLANT
GLOVE SURG SS PI 7.5 STRL IVOR (GLOVE) ×3 IMPLANT
GOWN STRL REUS W/ TWL LRG LVL3 (GOWN DISPOSABLE) ×1 IMPLANT
GOWN STRL REUS W/ TWL XL LVL3 (GOWN DISPOSABLE) ×1 IMPLANT
GOWN STRL REUS W/TWL LRG LVL3 (GOWN DISPOSABLE) ×2
GOWN STRL REUS W/TWL XL LVL3 (GOWN DISPOSABLE) ×5 IMPLANT
NEEDLE PRECISIONGLIDE 27X1.5 (NEEDLE) ×3 IMPLANT
NS IRRIG 1000ML POUR BTL (IV SOLUTION) ×3 IMPLANT
PACK BASIN DAY SURGERY FS (CUSTOM PROCEDURE TRAY) ×3 IMPLANT
STOCKINETTE 4X48 STRL (DRAPES) ×3 IMPLANT
SUT ETHILON 4 0 PS 2 18 (SUTURE) ×3 IMPLANT
SYR BULB 3OZ (MISCELLANEOUS) ×3 IMPLANT
SYR CONTROL 10ML LL (SYRINGE) ×3 IMPLANT
TOWEL OR 17X24 6PK STRL BLUE (TOWEL DISPOSABLE) ×6 IMPLANT
UNDERPAD 30X30 (UNDERPADS AND DIAPERS) ×3 IMPLANT

## 2018-01-13 NOTE — Op Note (Signed)
NAME:  Angela Bernard, Angela Bernard              ACCOUNT NO.:  [id number]  MEDICAL RECORD NO.:  [id number]  LOCaryl BisCATION:                                 FACILITY:  PHYSICIAN:  Cindee SaltGary Durga Saldarriaga, M.D.            DATE OF BIRTH:  DATE OF PROCEDURE:  01/13/2018 DATE OF DISCHARGE:                              OPERATIVE REPORT   PREOPERATIVE DIAGNOSIS:  Stenosing tenosynovitis, left thumb.  POSTOPERATIVE DIAGNOSIS:  Stenosing tenosynovitis, left thumb.  OPERATION:  Release of A1 pulley, left thumb.  SURGEON:  Cindee SaltGary Evangelyn Crouse, M.D.  ASSISTANT:  None.  ANESTHESIA:  Forearm-based IV regional with local infiltration.  [facility]  ANESTHESIOLOGIST:  [place].  HISTORY:  The patient is a 72 year old female with a history of triggering of her left thumb, which has not responded to multiple injections.  She has elected to undergo surgical release of the A1 pulley.  Pre, peri, and postoperative courses have been discussed along with risks and complications.  She is aware there is no guarantee to the surgery, the possibility of infection, recurrence of injury to arteries, nerves, and tendons, incomplete relief of symptoms, and dystrophy.  In the preoperative area, the patient is seen, the extremity marked by both patient and surgeon, antibiotic given.  DESCRIPTION OF PROCEDURE:  The patient was brought to the operating room where a forearm-based IV regional anesthetic was carried out without difficulty.  She was prepped using ChloraPrep in a supine position with the left arm free.  A 3-minute dry time was allowed and time-out was taken, confirming the patient and procedure.  After adequate anesthesia was afforded, a transverse incision was made over the A1 pulley, metacarpophalangeal joint crease of the left thumb and carried down through subcutaneous tissue.  Bleeders were electrocauterized as necessary with bipolar.  The A1 pulley was identified after placing retractors protecting  neurovascular bundles radially and ulnarly.  The A1 pulley was found to be markedly thickened.  There was an indentation in the FPL tendon.  The A1 pulley was released on its radial aspect. The oblique pulley was left intact.  The tenosynovial tissue proximally was separated with blunt dissection.  The thumb was placed through a full range motion and no further triggering was noted.  The wound was irrigated and the skin was closed with interrupted 4-0 nylon sutures. Local infiltration with 0.25% bupivacaine without epinephrine was given, probably 5 mL was used.  A sterile compressive dressing with the fingers and thumb free was applied.  On deflation of the tourniquet, all fingers immediately pinked.  She was taken to the recovery room for observation in satisfactory condition.  She will be discharged to home, [facility], on [place].          ______________________________ Cindee SaltGary Emmit Oriley, M.D.     GK/MEDQ  D:  01/13/2018  T:  01/13/2018  Job:  [id number]

## 2018-01-13 NOTE — Anesthesia Postprocedure Evaluation (Signed)
Anesthesia Post Note  Patient: Angela Bernard  Procedure(s) Performed: RELEASE TRIGGER FINGER/A-1 PULLEY LEFT THUMB (Left Hand)     Patient location during evaluation: PACU Anesthesia Type: Bier Block Level of consciousness: awake and alert Pain management: pain level controlled Vital Signs Assessment: post-procedure vital signs reviewed and stable Respiratory status: spontaneous breathing, nonlabored ventilation and respiratory function stable Cardiovascular status: stable and blood pressure returned to baseline Anesthetic complications: no    Last Vitals:  Vitals:   01/13/18 1302 01/13/18 1315  BP:  122/65  Pulse: 70 75  Resp: 15 15  Temp:  36.6 C  SpO2: 99% 98%    Last Pain:  Vitals:   01/13/18 1315  TempSrc: Oral  PainSc: 0-No pain                 Beryle Lathehomas E Brock

## 2018-01-13 NOTE — Transfer of Care (Signed)
Immediate Anesthesia Transfer of Care Note  Patient: Angela Bernard  Procedure(s) Performed: RELEASE TRIGGER FINGER/A-1 PULLEY LEFT THUMB (Left Hand)  Patient Location: PACU  Anesthesia Type:Bier block  Level of Consciousness: awake, oriented and patient cooperative  Airway & Oxygen Therapy: Patient Spontanous Breathing and Patient connected to face mask oxygen  Post-op Assessment: Report given to RN and Post -op Vital signs reviewed and stable  Post vital signs: Reviewed and stable  Last Vitals:  Vitals Value Taken Time  BP    Temp    Pulse 72 01/13/2018 12:34 PM  Resp 11 01/13/2018 12:34 PM  SpO2 99 % 01/13/2018 12:34 PM  Vitals shown include unvalidated device data.  Last Pain:  Vitals:   01/13/18 1137  TempSrc: Oral  PainSc: 0-No pain         Complications: No apparent anesthesia complications

## 2018-01-13 NOTE — Discharge Instructions (Addendum)

## 2018-01-13 NOTE — Brief Op Note (Signed)
01/13/2018  12:31 PM  PATIENT:  Angela Bernard  72 y.o. female  PRE-OPERATIVE DIAGNOSIS:  STENOSING TENOSYNOVITIS LEFT THUMB  POST-OPERATIVE DIAGNOSIS:  STENOSING TENOSYNOVITIS LEFT THUMB  PROCEDURE:  Procedure(s) with comments: RELEASE TRIGGER FINGER/A-1 PULLEY LEFT THUMB (Left) - FAB  SURGEON:  Surgeon(s) and Role:    * Cindee SaltKuzma, Thelonious Kauffmann, MD - Primary  PHYSICIAN ASSISTANT:   ASSISTANTS: none   ANESTHESIA:   local, regional and IV sedation  EBL:  none2  BLOOD ADMINISTERED:none  DRAINS: none   LOCAL MEDICATIONS USED:  BUPIVICAINE   SPECIMEN:  No Specimen  DISPOSITION OF SPECIMEN:  N/A  COUNTS:  YES  TOURNIQUET:   Total Tourniquet Time Documented: Forearm (Left) - 16 minutes Total: Forearm (Left) - 16 minutes   DICTATION: .Other Dictation: Dictation Number [id number]  PLAN OF CARE: Discharge to home after PACU  PATIENT DISPOSITION:  PACU - hemodynamically stable.

## 2018-01-13 NOTE — Op Note (Signed)
Other Dictation: Dictation Number [id number]

## 2018-01-13 NOTE — H&P (Signed)
  Angela Bernard is an 72 y.o. female.   Chief Complaint: catching left thumbHPI:Angela Bernard is a 72 year old right-hand-dominant female with triggering left thumb. She was last seen on 09/29/2017. This been injected on 2 occasions. She states it did extremely well until the past couple weeks when she began having catching again. She has mild discomfort with it. She is not complaining of any numbness or tingling. She states this become a significant annoyance. She has a history of diabetes and arthritis no history of thyroid problems or gout. Family history is negative for each of these also.      Past Medical History:  Diagnosis Date  . Diabetes mellitus without complication ([place])   . GERD (gastroesophageal reflux disease)   . High cholesterol     Past Surgical History:  Procedure Laterality Date  . ABDOMINAL HYSTERECTOMY    . CYST EXCISION    . MOUTH SURGERY      History reviewed. No pertinent family history. Social History:  reports that she has been smoking.  She has never used smokeless tobacco. She reports that she does not drink alcohol or use drugs.  Allergies:  Allergies  Allergen Reactions  . Ciprofloxacin Other (See Comments)    Bladder spasm    No medications prior to admission.    Results for orders placed or performed during the hospital encounter of 01/13/18 (from the past 48 hour(s))  Basic metabolic panel     Status: Abnormal   Collection Time: 01/12/18  3:08 PM  Result Value Ref Range   Sodium 136 135 - 145 mmol/L   Potassium 4.5 3.5 - 5.1 mmol/L   Chloride 103 101 - 111 mmol/L   CO2 21 (L) 22 - 32 mmol/L   Glucose, Bld 309 (H) 65 - 99 mg/dL   BUN 15 6 - 20 mg/dL   Creatinine, Ser 0.95 0.44 - 1.00 mg/dL   Calcium 9.0 8.9 - 10.3 mg/dL   GFR calc non Af Amer 60 (L) >60 mL/min   GFR calc Af Amer >60 >60 mL/min    Comment: (NOTE) The eGFR has been calculated using the CKD EPI equation. This calculation has not been validated in all clinical  situations. eGFR's persistently <60 mL/min signify possible Chronic Kidney Disease.    Anion gap 12 5 - 15    Comment: Performed at [facility] [address], [place], [place] [address]    No results found.   Pertinent items are noted in HPI.  Height '5\' 5"'$  (1.651 m), weight 68 kg (150 lb).  General appearance: alert, cooperative and appears stated age Head: Normocephalic, without obvious abnormality Neck: no JVD Resp: clear to auscultation bilaterally Cardio: regular rate and rhythm, S1, S2 normal, no murmur, click, rub or gallop GI: soft, non-tender; bowel sounds normal; no masses,  no organomegaly Extremities: catching left thumb Pulses: 2+ and symmetric Skin: Skin color, texture, turgor normal. No rashes or lesions Neurologic: Grossly normal Incision/Wound: na  Assessment/Plan Assessment:  1. Trigger finger of left thumb    Plan: She would like to proceed to have this surgically released. Pre-peri-and postoperative course are discussed along with risk complications. She is aware that there is no guarantee to the surgery the possibility of infection recurrence injury to arteries nerves tendons incomplete relief symptoms and dystrophy. She is scheduled for release A1 pulley left thumb as an outpatient under regional anesthesia.      Nautia Lem R 01/13/2018, 10:01 AM

## 2018-01-13 NOTE — Anesthesia Preprocedure Evaluation (Addendum)
Anesthesia Evaluation  Patient identified by MRN, date of birth, ID band Patient awake    Reviewed: Allergy & Precautions, NPO status , Patient's Chart, lab work & pertinent test results  Airway Mallampati: II  TM Distance: >3 FB Neck ROM: Full    Dental  (+) Edentulous Upper, Edentulous Lower   Pulmonary Current Smoker,    Pulmonary exam normal breath sounds clear to auscultation       Cardiovascular Exercise Tolerance: Good negative cardio ROS Normal cardiovascular exam Rhythm:Regular Rate:Normal     Neuro/Psych negative neurological ROS  negative psych ROS   GI/Hepatic Neg liver ROS, GERD  Medicated and Controlled,  Endo/Other  diabetes, Well Controlled, Type 2, Oral Hypoglycemic Agents  Renal/GU negative Renal ROS  negative genitourinary   Musculoskeletal negative musculoskeletal ROS (+)   Abdominal   Peds  Hematology negative hematology ROS (+)   Anesthesia Other Findings   Reproductive/Obstetrics                            Anesthesia Physical Anesthesia Plan  ASA: II  Anesthesia Plan: Bier Block and Bier Block-LIDOCAINE ONLY   Post-op Pain Management:    Induction: Intravenous  PONV Risk Score and Plan: Propofol infusion and Treatment may vary due to age or medical condition  Airway Management Planned: Natural Airway and Mask  Additional Equipment: None  Intra-op Plan:   Post-operative Plan:   Informed Consent: I have reviewed the patients History and Physical, chart, labs and discussed the procedure including the risks, benefits and alternatives for the proposed anesthesia with the patient or authorized representative who has indicated his/her understanding and acceptance.     Plan Discussed with: CRNA and Anesthesiologist  Anesthesia Plan Comments:         Anesthesia Quick Evaluation

## 2018-01-14 ENCOUNTER — Encounter (HOSPITAL_BASED_OUTPATIENT_CLINIC_OR_DEPARTMENT_OTHER): Payer: Self-pay | Admitting: Orthopedic Surgery

## 2018-03-23 DIAGNOSIS — J069 Acute upper respiratory infection, unspecified: Secondary | ICD-10-CM | POA: Diagnosis not present

## 2018-04-08 DIAGNOSIS — E119 Type 2 diabetes mellitus without complications: Secondary | ICD-10-CM | POA: Diagnosis not present

## 2018-04-08 DIAGNOSIS — H40023 Open angle with borderline findings, high risk, bilateral: Secondary | ICD-10-CM | POA: Diagnosis not present

## 2018-04-08 DIAGNOSIS — H40053 Ocular hypertension, bilateral: Secondary | ICD-10-CM | POA: Diagnosis not present

## 2018-04-20 DIAGNOSIS — E782 Mixed hyperlipidemia: Secondary | ICD-10-CM | POA: Diagnosis not present

## 2018-04-20 DIAGNOSIS — D649 Anemia, unspecified: Secondary | ICD-10-CM | POA: Diagnosis not present

## 2018-04-20 DIAGNOSIS — Z0000 Encounter for general adult medical examination without abnormal findings: Secondary | ICD-10-CM | POA: Diagnosis not present

## 2018-04-20 DIAGNOSIS — I10 Essential (primary) hypertension: Secondary | ICD-10-CM | POA: Diagnosis not present

## 2018-04-20 DIAGNOSIS — E1121 Type 2 diabetes mellitus with diabetic nephropathy: Secondary | ICD-10-CM | POA: Diagnosis not present

## 2018-04-20 DIAGNOSIS — Z7984 Long term (current) use of oral hypoglycemic drugs: Secondary | ICD-10-CM | POA: Diagnosis not present

## 2018-04-20 DIAGNOSIS — F17200 Nicotine dependence, unspecified, uncomplicated: Secondary | ICD-10-CM | POA: Diagnosis not present

## 2018-04-20 DIAGNOSIS — R05 Cough: Secondary | ICD-10-CM | POA: Diagnosis not present

## 2018-04-22 ENCOUNTER — Ambulatory Visit
Admission: RE | Admit: 2018-04-22 | Discharge: 2018-04-22 | Disposition: A | Discharge status: Home / Self Care | Payer: Medicare HMO | Source: Ambulatory Visit | Attending: Family Medicine | Admitting: Family Medicine

## 2018-04-22 ENCOUNTER — Other Ambulatory Visit: Payer: Self-pay | Admitting: Family Medicine

## 2018-04-22 DIAGNOSIS — R05 Cough: Secondary | ICD-10-CM

## 2018-04-22 DIAGNOSIS — R059 Cough, unspecified: Secondary | ICD-10-CM

## 2018-04-22 DIAGNOSIS — R102 Pelvic and perineal pain: Secondary | ICD-10-CM

## 2018-04-29 ENCOUNTER — Ambulatory Visit
Admission: RE | Admit: 2018-04-29 | Discharge: 2018-04-29 | Disposition: A | Discharge status: Home / Self Care | Payer: Medicare HMO | Source: Ambulatory Visit | Attending: Family Medicine | Admitting: Family Medicine

## 2018-04-29 DIAGNOSIS — R102 Pelvic and perineal pain: Secondary | ICD-10-CM

## 2018-05-27 DIAGNOSIS — H04213 Epiphora due to excess lacrimation, bilateral lacrimal glands: Secondary | ICD-10-CM | POA: Diagnosis not present

## 2018-05-27 DIAGNOSIS — H40023 Open angle with borderline findings, high risk, bilateral: Secondary | ICD-10-CM | POA: Diagnosis not present

## 2018-06-30 DIAGNOSIS — Z23 Encounter for immunization: Secondary | ICD-10-CM | POA: Diagnosis not present

## 2018-08-05 DIAGNOSIS — Z803 Family history of malignant neoplasm of breast: Secondary | ICD-10-CM | POA: Diagnosis not present

## 2018-08-05 DIAGNOSIS — Z1231 Encounter for screening mammogram for malignant neoplasm of breast: Secondary | ICD-10-CM | POA: Diagnosis not present

## 2018-08-10 DIAGNOSIS — Z90710 Acquired absence of both cervix and uterus: Secondary | ICD-10-CM | POA: Diagnosis not present

## 2018-08-10 DIAGNOSIS — M8589 Other specified disorders of bone density and structure, multiple sites: Secondary | ICD-10-CM | POA: Diagnosis not present

## 2018-11-13 DIAGNOSIS — H40023 Open angle with borderline findings, high risk, bilateral: Secondary | ICD-10-CM | POA: Diagnosis not present

## 2018-12-11 DIAGNOSIS — H04123 Dry eye syndrome of bilateral lacrimal glands: Secondary | ICD-10-CM | POA: Diagnosis not present

## 2019-05-13 DIAGNOSIS — J019 Acute sinusitis, unspecified: Secondary | ICD-10-CM | POA: Diagnosis not present

## 2019-05-14 DIAGNOSIS — R509 Fever, unspecified: Secondary | ICD-10-CM | POA: Diagnosis not present

## 2019-06-24 DIAGNOSIS — E782 Mixed hyperlipidemia: Secondary | ICD-10-CM | POA: Diagnosis not present

## 2019-06-24 DIAGNOSIS — Z0000 Encounter for general adult medical examination without abnormal findings: Secondary | ICD-10-CM | POA: Diagnosis not present

## 2019-06-24 DIAGNOSIS — I10 Essential (primary) hypertension: Secondary | ICD-10-CM | POA: Diagnosis not present

## 2019-06-24 DIAGNOSIS — Z1211 Encounter for screening for malignant neoplasm of colon: Secondary | ICD-10-CM | POA: Diagnosis not present

## 2019-06-24 DIAGNOSIS — E1121 Type 2 diabetes mellitus with diabetic nephropathy: Secondary | ICD-10-CM | POA: Diagnosis not present

## 2019-06-24 DIAGNOSIS — F17200 Nicotine dependence, unspecified, uncomplicated: Secondary | ICD-10-CM | POA: Diagnosis not present

## 2019-07-01 DIAGNOSIS — E119 Type 2 diabetes mellitus without complications: Secondary | ICD-10-CM | POA: Diagnosis not present

## 2019-07-02 DIAGNOSIS — I10 Essential (primary) hypertension: Secondary | ICD-10-CM | POA: Diagnosis not present

## 2019-07-02 DIAGNOSIS — E782 Mixed hyperlipidemia: Secondary | ICD-10-CM | POA: Diagnosis not present

## 2019-07-02 DIAGNOSIS — E1121 Type 2 diabetes mellitus with diabetic nephropathy: Secondary | ICD-10-CM | POA: Diagnosis not present

## 2019-07-02 DIAGNOSIS — D649 Anemia, unspecified: Secondary | ICD-10-CM | POA: Diagnosis not present

## 2019-07-29 DIAGNOSIS — H40023 Open angle with borderline findings, high risk, bilateral: Secondary | ICD-10-CM | POA: Diagnosis not present

## 2019-08-26 DIAGNOSIS — Z1231 Encounter for screening mammogram for malignant neoplasm of breast: Secondary | ICD-10-CM | POA: Diagnosis not present

## 2019-10-14 DIAGNOSIS — M65311 Trigger thumb, right thumb: Secondary | ICD-10-CM | POA: Diagnosis not present

## 2019-11-26 DIAGNOSIS — M65311 Trigger thumb, right thumb: Secondary | ICD-10-CM | POA: Diagnosis not present

## 2019-12-09 DIAGNOSIS — H401131 Primary open-angle glaucoma, bilateral, mild stage: Secondary | ICD-10-CM | POA: Diagnosis not present

## 2019-12-27 DIAGNOSIS — H401131 Primary open-angle glaucoma, bilateral, mild stage: Secondary | ICD-10-CM | POA: Diagnosis not present

## 2019-12-28 IMAGING — US US PELVIS COMPLETE TRANSABD/TRANSVAG
1 series · 14 of 25 positions shown · non-contrast
Comparison: None

CLINICAL DATA: Initial evaluation for pelvic pain for 1 month.
History of prior hysterectomy.

EXAM:
TRANSABDOMINAL AND TRANSVAGINAL ULTRASOUND OF PELVIS
TECHNIQUE: Both transabdominal and transvaginal ultrasound examinations of the
pelvis were performed. Transabdominal technique was performed for
global imaging of the pelvis including uterus, ovaries, adnexal
regions, and pelvic cul-de-sac. It was necessary to proceed with
endovaginal exam following the transabdominal exam to visualize the
pelvic structures.

[Series 1: us pelvis complete transabd/transvag · 0.25mm/px · 14 of 33 slices shown]
[im 1/33]
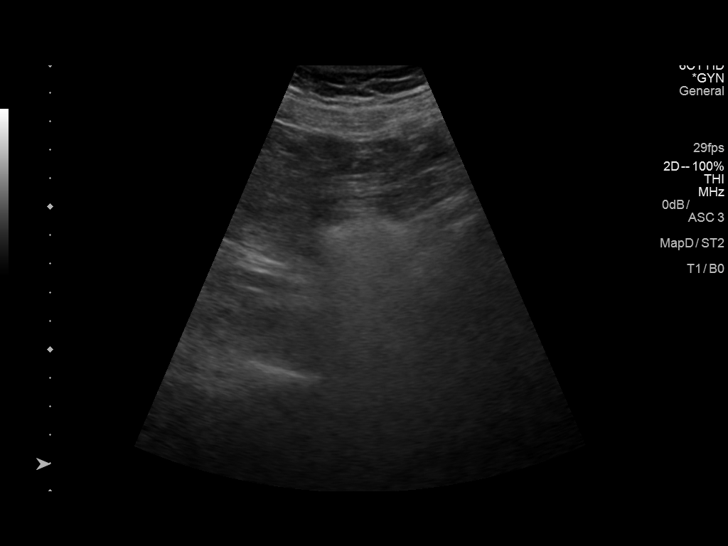
[im 3/33]
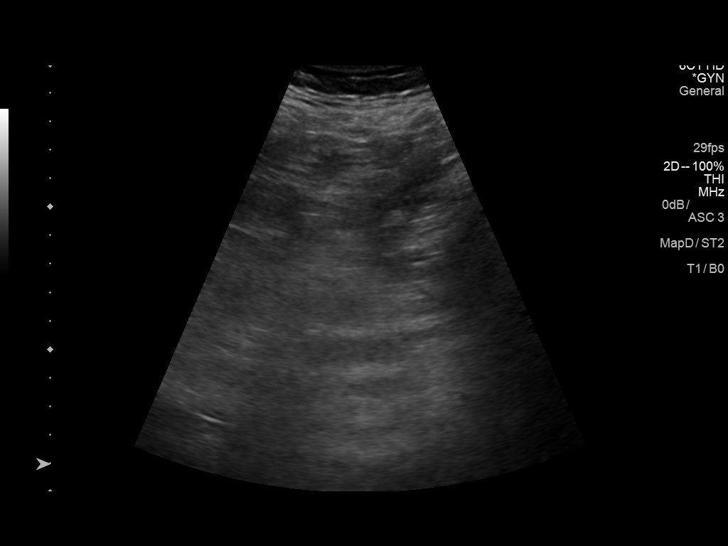
[im 6/33]
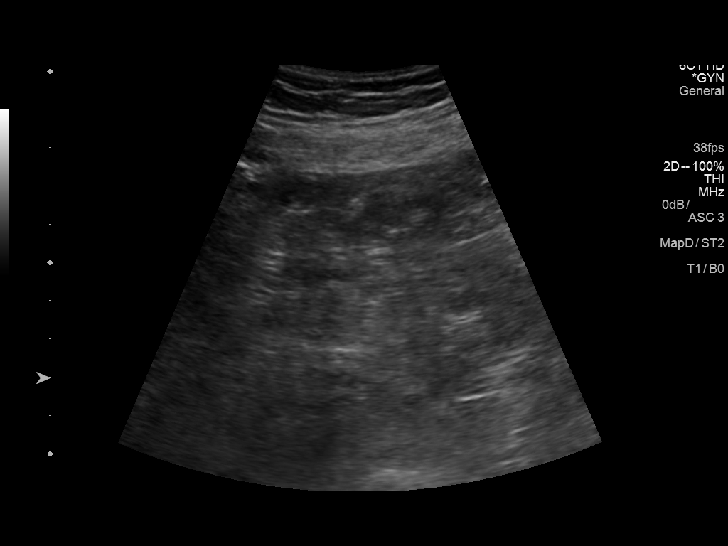
[im 9/33]
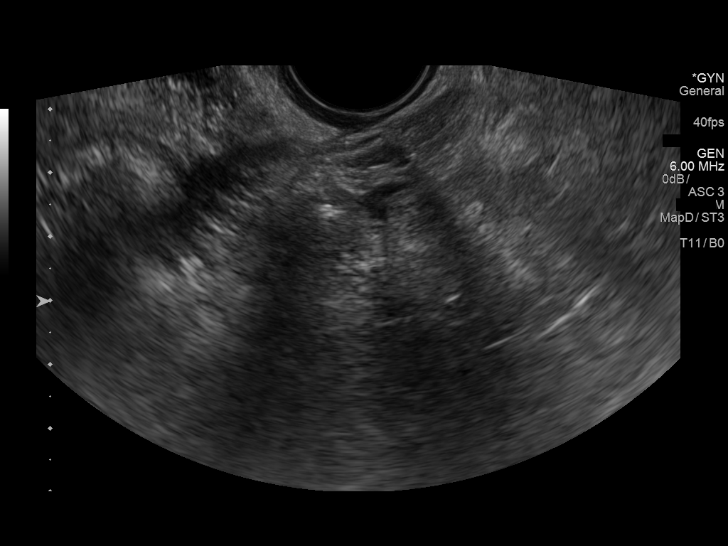
[im 11/33]
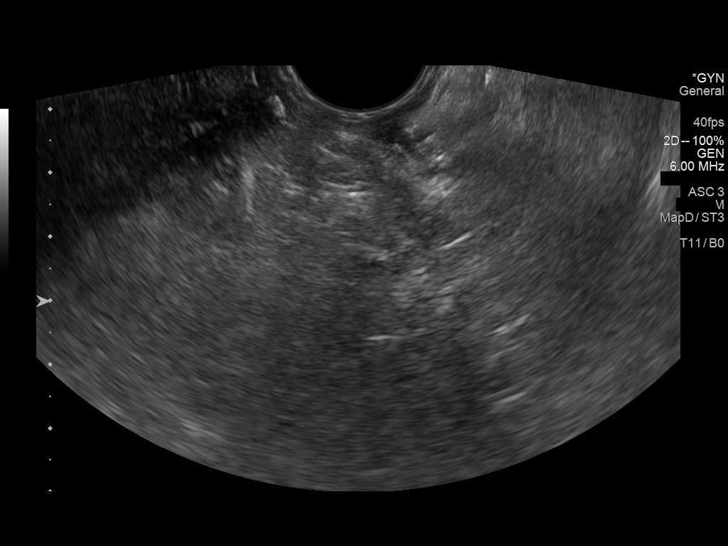
[im 13/33]
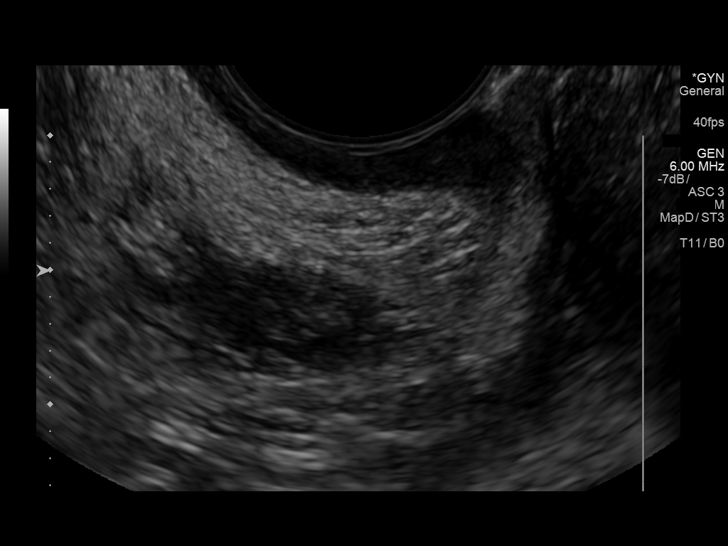
[im 15/33]
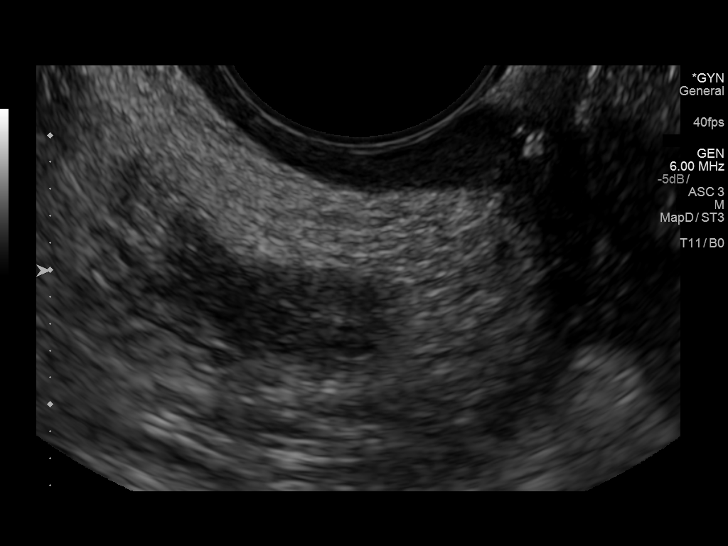
[im 18/33]
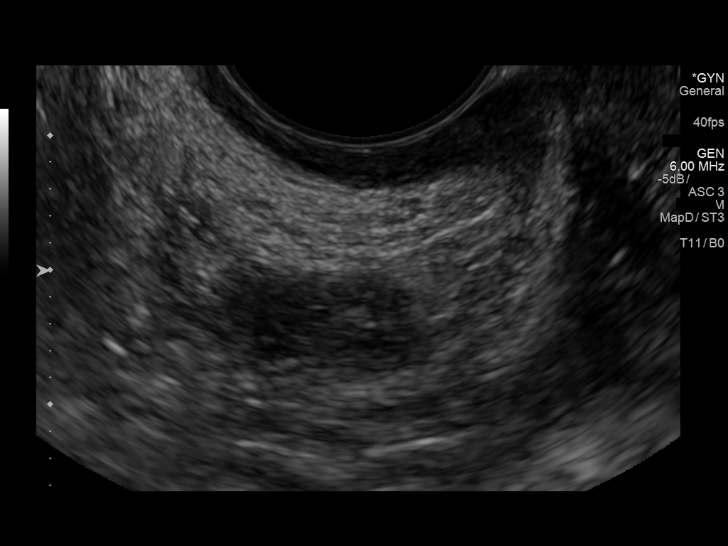
[im 21/33]
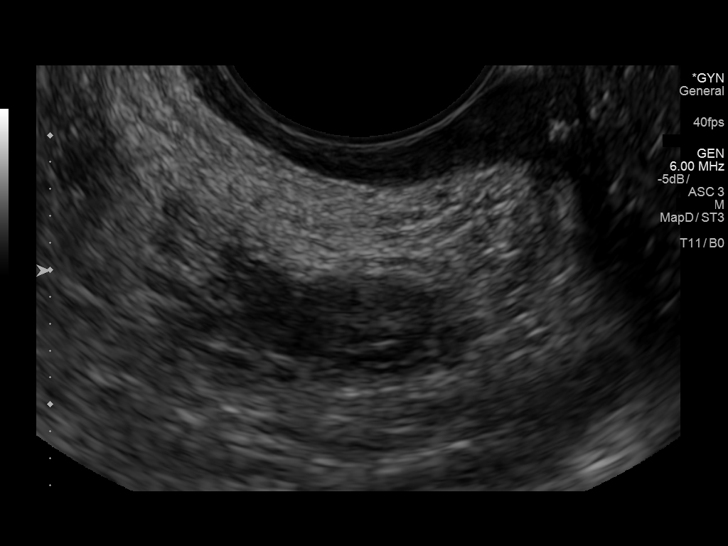
[im 22/33]
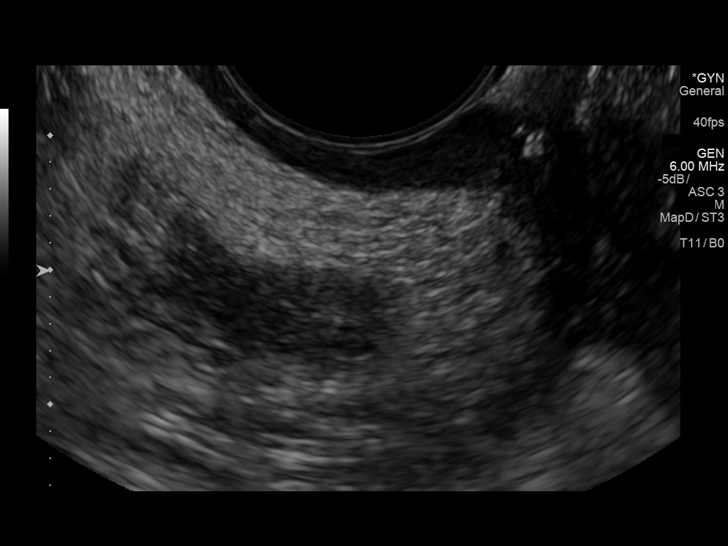
[im 25/33]
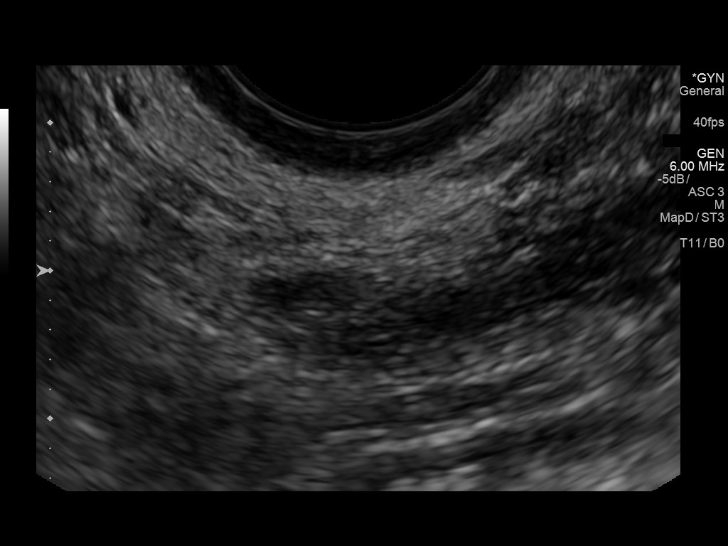
[im 27/33]
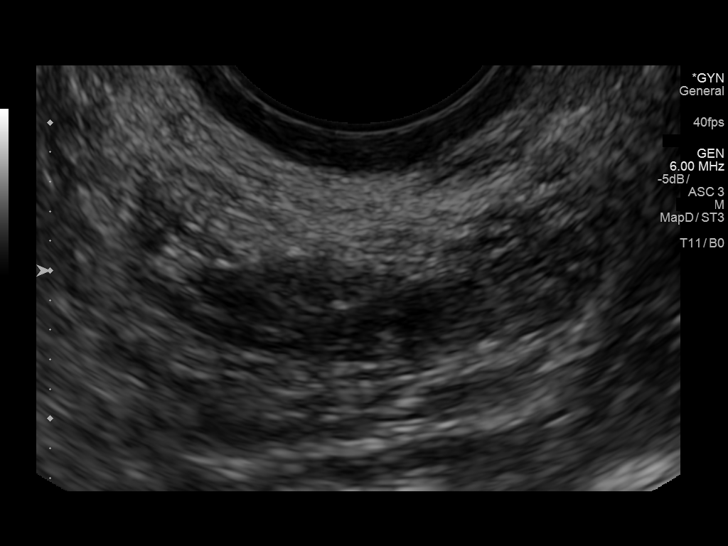
[im 30/33]
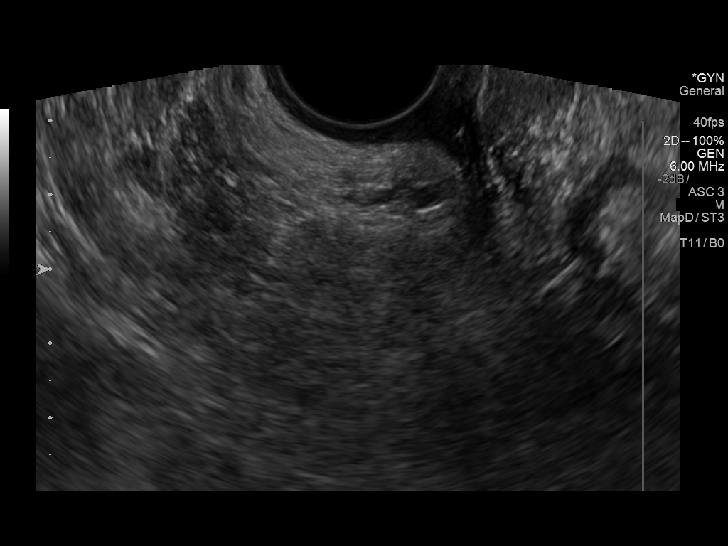
[im 33/33]
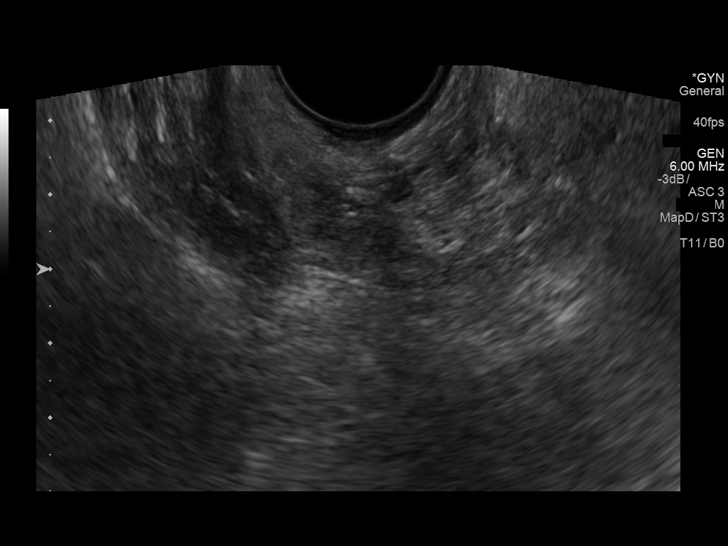

[14 of 25 positions shown; findings below may reference images not displayed]

FINDINGS: Uterus

Status post hysterectomy.  No abnormality about the vaginal cuff.

Endometrium

Surgically absent.

Right ovary

Measurements: 1.9 x 0.8 x 0.7 cm. Normal appearance/no adnexal mass.

Left ovary

Not visualized.  No adnexal mass.

Other findings

No abnormal free fluid.
IMPRESSION: 1. Status post hysterectomy with nonvisualization of the uterus and
left ovary. No pelvic or adnexal mass. No abnormal free fluid.
2. Normal right ovary.

## 2020-01-18 ENCOUNTER — Other Ambulatory Visit: Payer: Self-pay | Admitting: Orthopedic Surgery

## 2020-02-01 ENCOUNTER — Other Ambulatory Visit: Payer: Self-pay

## 2020-02-01 ENCOUNTER — Encounter (HOSPITAL_BASED_OUTPATIENT_CLINIC_OR_DEPARTMENT_OTHER): Payer: Self-pay | Admitting: Orthopedic Surgery

## 2020-02-04 ENCOUNTER — Other Ambulatory Visit (HOSPITAL_COMMUNITY)
Admission: RE | Admit: 2020-02-04 | Discharge: 2020-02-04 | Disposition: A | Discharge status: Home / Self Care | Payer: Medicare HMO | Source: Ambulatory Visit | Attending: Orthopedic Surgery | Admitting: Orthopedic Surgery

## 2020-02-04 ENCOUNTER — Encounter (HOSPITAL_BASED_OUTPATIENT_CLINIC_OR_DEPARTMENT_OTHER)
Admission: RE | Admit: 2020-02-04 | Discharge: 2020-02-04 | Disposition: A | Discharge status: Home / Self Care | Payer: Medicare HMO | Source: Ambulatory Visit | Attending: Orthopedic Surgery | Admitting: Orthopedic Surgery

## 2020-02-04 DIAGNOSIS — Z20822 Contact with and (suspected) exposure to covid-19: Secondary | ICD-10-CM | POA: Diagnosis not present

## 2020-02-04 DIAGNOSIS — E7800 Pure hypercholesterolemia, unspecified: Secondary | ICD-10-CM | POA: Diagnosis not present

## 2020-02-04 DIAGNOSIS — K219 Gastro-esophageal reflux disease without esophagitis: Secondary | ICD-10-CM | POA: Diagnosis not present

## 2020-02-04 DIAGNOSIS — F17210 Nicotine dependence, cigarettes, uncomplicated: Secondary | ICD-10-CM | POA: Diagnosis not present

## 2020-02-04 DIAGNOSIS — Z881 Allergy status to other antibiotic agents status: Secondary | ICD-10-CM | POA: Diagnosis not present

## 2020-02-04 DIAGNOSIS — M65311 Trigger thumb, right thumb: Secondary | ICD-10-CM | POA: Diagnosis not present

## 2020-02-04 DIAGNOSIS — Z01812 Encounter for preprocedural laboratory examination: Secondary | ICD-10-CM | POA: Diagnosis not present

## 2020-02-04 DIAGNOSIS — E119 Type 2 diabetes mellitus without complications: Secondary | ICD-10-CM | POA: Diagnosis not present

## 2020-02-04 DIAGNOSIS — Z90710 Acquired absence of both cervix and uterus: Secondary | ICD-10-CM | POA: Diagnosis not present

## 2020-02-04 LAB — SARS CORONAVIRUS 2 (TAT 6-24 HRS): SARS Coronavirus 2: NEGATIVE

## 2020-02-04 LAB — BASIC METABOLIC PANEL
Anion gap: 11 (ref 5–15)
BUN: 14 mg/dL (ref 8–23)
CO2: 23 mmol/L (ref 22–32)
Calcium: 9.2 mg/dL (ref 8.9–10.3)
Chloride: 108 mmol/L (ref 98–111)
Creatinine, Ser: 0.93 mg/dL (ref 0.44–1.00)
GFR calc Af Amer: >60 mL/min (ref >60)
GFR calc non Af Amer: >60 mL/min (ref >60)
Glucose, Bld: 118 mg/dL — ABNORMAL HIGH (ref 70–99)
Potassium: 4.1 mmol/L (ref 3.5–5.1)
Sodium: 142 mmol/L (ref 135–145)

## 2020-02-04 NOTE — Progress Notes (Signed)

## 2020-02-08 ENCOUNTER — Other Ambulatory Visit: Payer: Self-pay

## 2020-02-08 ENCOUNTER — Encounter (HOSPITAL_BASED_OUTPATIENT_CLINIC_OR_DEPARTMENT_OTHER)
Admission: RE | Disposition: A | Discharge status: Home / Self Care | Payer: Self-pay | Source: Home / Self Care | Attending: Orthopedic Surgery

## 2020-02-08 ENCOUNTER — Ambulatory Visit (HOSPITAL_BASED_OUTPATIENT_CLINIC_OR_DEPARTMENT_OTHER)
Admission: RE | Admit: 2020-02-08 | Discharge: 2020-02-08 | Disposition: A | Discharge status: Home / Self Care | Payer: Medicare HMO | Attending: Orthopedic Surgery | Admitting: Orthopedic Surgery

## 2020-02-08 ENCOUNTER — Ambulatory Visit (HOSPITAL_BASED_OUTPATIENT_CLINIC_OR_DEPARTMENT_OTHER): Payer: Medicare HMO | Admitting: Anesthesiology

## 2020-02-08 ENCOUNTER — Encounter (HOSPITAL_BASED_OUTPATIENT_CLINIC_OR_DEPARTMENT_OTHER): Payer: Self-pay | Admitting: Orthopedic Surgery

## 2020-02-08 DIAGNOSIS — E7800 Pure hypercholesterolemia, unspecified: Secondary | ICD-10-CM | POA: Insufficient documentation

## 2020-02-08 DIAGNOSIS — Z90710 Acquired absence of both cervix and uterus: Secondary | ICD-10-CM | POA: Insufficient documentation

## 2020-02-08 DIAGNOSIS — K219 Gastro-esophageal reflux disease without esophagitis: Secondary | ICD-10-CM | POA: Insufficient documentation

## 2020-02-08 DIAGNOSIS — E119 Type 2 diabetes mellitus without complications: Secondary | ICD-10-CM | POA: Insufficient documentation

## 2020-02-08 DIAGNOSIS — F17210 Nicotine dependence, cigarettes, uncomplicated: Secondary | ICD-10-CM | POA: Diagnosis not present

## 2020-02-08 DIAGNOSIS — Z881 Allergy status to other antibiotic agents status: Secondary | ICD-10-CM | POA: Insufficient documentation

## 2020-02-08 DIAGNOSIS — M65311 Trigger thumb, right thumb: Secondary | ICD-10-CM | POA: Diagnosis not present

## 2020-02-08 HISTORY — PX: TRIGGER FINGER RELEASE: SHX641

## 2020-02-08 LAB — GLUCOSE, CAPILLARY
Glucose-Capillary: 112 mg/dL — ABNORMAL HIGH (ref 70–99)
Glucose-Capillary: 142 mg/dL — ABNORMAL HIGH (ref 70–99)

## 2020-02-08 SURGERY — RELEASE, A1 PULLEY, FOR TRIGGER FINGER
Anesthesia: Monitor Anesthesia Care | Site: Hand | Laterality: Right

## 2020-02-08 MED ORDER — PROPOFOL 500 MG/50ML IV EMUL
INTRAVENOUS | Status: DC | PRN
  Administered 2020-02-08: 50 ug/kg/min via INTRAVENOUS

## 2020-02-08 MED ORDER — FENTANYL CITRATE (PF) 100 MCG/2ML IJ SOLN
INTRAMUSCULAR | Status: DC | PRN
  Administered 2020-02-08: 100 ug via INTRAVENOUS

## 2020-02-08 MED ORDER — OXYCODONE HCL 5 MG PO TABS: 5.0000 mg | ORAL_TABLET | Freq: Once | ORAL | Status: DC | PRN

## 2020-02-08 MED ORDER — MIDAZOLAM HCL 2 MG/2ML IJ SOLN: 1.0000 mg | INTRAMUSCULAR | Status: DC | PRN

## 2020-02-08 MED ORDER — CEFAZOLIN SODIUM-DEXTROSE 2-4 GM/100ML-% IV SOLN
INTRAVENOUS | Status: AC
  Filled 2020-02-08: qty 100

## 2020-02-08 MED ORDER — BUPIVACAINE HCL (PF) 0.25 % IJ SOLN
INTRAMUSCULAR | Status: DC | PRN
  Administered 2020-02-08: 3 mL

## 2020-02-08 MED ORDER — CEFAZOLIN SODIUM-DEXTROSE 2-4 GM/100ML-% IV SOLN
2.0000 g | INTRAVENOUS | Status: AC
  Administered 2020-02-08: 2 g via INTRAVENOUS

## 2020-02-08 MED ORDER — OXYCODONE HCL 5 MG/5ML PO SOLN: 5.0000 mg | Freq: Once | ORAL | Status: DC | PRN

## 2020-02-08 MED ORDER — ONDANSETRON HCL 4 MG/2ML IJ SOLN: 4.0000 mg | Freq: Four times a day (QID) | INTRAMUSCULAR | Status: DC | PRN

## 2020-02-08 MED ORDER — PROPOFOL 10 MG/ML IV BOLUS
INTRAVENOUS | Status: DC | PRN
  Administered 2020-02-08: 40 mg via INTRAVENOUS

## 2020-02-08 MED ORDER — FENTANYL CITRATE (PF) 100 MCG/2ML IJ SOLN
INTRAMUSCULAR | Status: AC
  Filled 2020-02-08: qty 2

## 2020-02-08 MED ORDER — LACTATED RINGERS IV SOLN: INTRAVENOUS | Status: DC

## 2020-02-08 MED ORDER — TRAMADOL HCL 50 MG PO TABS: 50.0000 mg | ORAL_TABLET | Freq: Four times a day (QID) | ORAL | 0 refills | Status: AC | PRN

## 2020-02-08 MED ORDER — FENTANYL CITRATE (PF) 100 MCG/2ML IJ SOLN: 50.0000 ug | INTRAMUSCULAR | Status: DC | PRN

## 2020-02-08 MED ORDER — FENTANYL CITRATE (PF) 100 MCG/2ML IJ SOLN: 25.0000 ug | INTRAMUSCULAR | Status: DC | PRN

## 2020-02-08 SURGICAL SUPPLY — 33 items
BLADE SURG 15 STRL LF DISP TIS (BLADE) ×1 IMPLANT
BLADE SURG 15 STRL SS (BLADE) ×2
BNDG COHESIVE 2X5 TAN STRL LF (GAUZE/BANDAGES/DRESSINGS) ×3 IMPLANT
BNDG ESMARK 4X9 LF (GAUZE/BANDAGES/DRESSINGS) ×3 IMPLANT
CHLORAPREP W/TINT 26 (MISCELLANEOUS) ×3 IMPLANT
CORD BIPOLAR FORCEPS 12FT (ELECTRODE) IMPLANT
COVER BACK TABLE 60X90IN (DRAPES) ×3 IMPLANT
COVER MAYO STAND STRL (DRAPES) ×3 IMPLANT
COVER WAND RF STERILE (DRAPES) IMPLANT
CUFF TOURN SGL QUICK 18X4 (TOURNIQUET CUFF) ×3 IMPLANT
DECANTER SPIKE VIAL GLASS SM (MISCELLANEOUS) IMPLANT
DRAPE EXTREMITY T 121X128X90 (DISPOSABLE) ×3 IMPLANT
DRAPE SURG 17X23 STRL (DRAPES) ×3 IMPLANT
GAUZE SPONGE 4X4 12PLY STRL (GAUZE/BANDAGES/DRESSINGS) ×3 IMPLANT
GAUZE XEROFORM 1X8 LF (GAUZE/BANDAGES/DRESSINGS) ×3 IMPLANT
GLOVE BIOGEL PI IND STRL 7.0 (GLOVE) ×2 IMPLANT
GLOVE BIOGEL PI IND STRL 8.5 (GLOVE) ×1 IMPLANT
GLOVE BIOGEL PI INDICATOR 7.0 (GLOVE) ×4
GLOVE BIOGEL PI INDICATOR 8.5 (GLOVE) ×2
GLOVE ECLIPSE 6.5 STRL STRAW (GLOVE) ×3 IMPLANT
GLOVE SURG ORTHO 8.0 STRL STRW (GLOVE) ×3 IMPLANT
GOWN STRL REUS W/ TWL LRG LVL3 (GOWN DISPOSABLE) ×1 IMPLANT
GOWN STRL REUS W/TWL LRG LVL3 (GOWN DISPOSABLE) ×2
GOWN STRL REUS W/TWL XL LVL3 (GOWN DISPOSABLE) ×3 IMPLANT
NEEDLE PRECISIONGLIDE 27X1.5 (NEEDLE) ×3 IMPLANT
NS IRRIG 1000ML POUR BTL (IV SOLUTION) ×3 IMPLANT
SET BASIN DAY SURGERY F.S. (CUSTOM PROCEDURE TRAY) ×3 IMPLANT
STOCKINETTE 4X48 STRL (DRAPES) ×3 IMPLANT
SUT ETHILON 4 0 PS 2 18 (SUTURE) ×3 IMPLANT
SYR BULB EAR ULCER 3OZ GRN STR (SYRINGE) ×3 IMPLANT
SYR CONTROL 10ML LL (SYRINGE) ×3 IMPLANT
TOWEL GREEN STERILE FF (TOWEL DISPOSABLE) ×6 IMPLANT
UNDERPAD 30X36 HEAVY ABSORB (UNDERPADS AND DIAPERS) ×3 IMPLANT

## 2020-02-08 NOTE — Brief Op Note (Signed)
02/08/2020  10:03 AM  PATIENT:  Angela Bernard  74 y.o. female  PRE-OPERATIVE DIAGNOSIS:  RIGHT TRIGGER THUMB  POST-OPERATIVE DIAGNOSIS:  RIGHT TRIGGER THUMB  PROCEDURE:  Procedure(s) with comments: RELEASE TRIGGER FINGER/A-1 PULLEY (Right) - IV REGIONAL FOREARM BLOCK  SURGEON:  Surgeon(s) and Role:    * Cindee Salt, MD - Primary  PHYSICIAN ASSISTANT:   ASSISTANTS: none   ANESTHESIA:   local and IV sedation  EBL:  62ml  BLOOD ADMINISTERED:none  DRAINS: none   LOCAL MEDICATIONS USED:  BUPIVICAINE   SPECIMEN:  No Specimen  DISPOSITION OF SPECIMEN:  N/A  COUNTS:  YES  TOURNIQUET:   Total Tourniquet Time Documented: Forearm (Right) - 8 minutes Total: Forearm (Right) - 8 minutes   DICTATION: .Dragon Dictation  PLAN OF CARE: Discharge to home after PACU  PATIENT DISPOSITION:  PACU - hemodynamically stable.

## 2020-02-08 NOTE — Op Note (Signed)
NAME: Angela Bernard MEDICAL RECORD NO: [id number] DATE OF BIRTH: 1945-10-21 FACILITY: [facility] LOCATION: [facility] PHYSICIAN: Nicki Reaper, MD   OPERATIVE REPORT   DATE OF PROCEDURE: 02/08/20    PREOPERATIVE DIAGNOSIS:   Stenosing tenosynovitis right thumb   POSTOPERATIVE DIAGNOSIS:   Same   PROCEDURE:   Release A1 pulley right thumb   SURGEON: Cindee Salt, M.D.   ASSISTANT: none   ANESTHESIA:  Local with sedation   INTRAVENOUS FLUIDS:  Per anesthesia flow sheet.   ESTIMATED BLOOD LOSS:  Minimal.   COMPLICATIONS:  None.   SPECIMENS:  none   TOURNIQUET TIME:    Total Tourniquet Time Documented: Forearm (Right) - 8 minutes Total: Forearm (Right) - 8 minutes    DISPOSITION:  Stable to PACU.   INDICATIONS: Patient is a 74 year old female with history of triggering of her right thumb.  This not responded to local treatment and multiple injections.  She has elected undergo release of the A1 pulley.  Preperi-and postoperative course been discussed along with risks and complications.  She is aware that there is no guarantee to the surgery the possibility of infection recurrence injury to arteries nerves tendons incomplete relief of symptoms and dystrophy.  Preoperative area the patient seen the extremity marked by both patient and surgeon antibiotic given  OPERATIVE COURSE: Patient brought to the operating room where she was prepped and draped supine position with the right arm free.  Multiple attempts were performed by anesthesia and effort to establish an IV for a forearm the IV regional.  This was unsuccessful.  Was decided to proceed with local and sedation.  Following prep with ChloraPrep and a 3-minute dry time a timeout was taken to confirm patient procedure.  A local infiltration with quarter percent bupivacaine without epinephrine was given approximately 3 to 4 cc was used.  After adequate anesthesia was established, the limb was exsanguinated  with an Esmarch bandage tourniquet placed on the forearm was inflated to 250 mmHg.  Transverse incision was made over the A1 pulley of the right thumb.  This carried down through subcutaneous tissue.  Bleeders were electrocauterized as necessary with bipolar.  Neurovascular structures identified protected with retractors.  The A1 pulley was found to be markedly thickened.  This was released on its radial aspect preserving the oblique pulley tenosynovial tissue proximally was separated with blunt dissection.  The thumb was placed through full flexion full extension no further triggering was noted.  The wound was copiously irrigated with saline.  The skin was closed erupted 4-0 nylon sutures.  A skin sterile compressive dressing was applied to the fingers and thumb free.  Inflation the tourniquet all fingers immediately pink.  She was taken to the recovery room for observation in satisfactory condition.  She will be discharged home to return to the [facility] in 1 week on Tylenol with ibuprofen and Ultram for breakthrough.  Cindee Salt, MD Electronically signed, 02/08/20

## 2020-02-08 NOTE — Anesthesia Preprocedure Evaluation (Signed)
Anesthesia Evaluation  Patient identified by MRN, date of birth, ID band Patient awake    Reviewed: Allergy & Precautions, H&P , NPO status , Patient's Chart, lab work & pertinent test results  Airway Mallampati: II   Neck ROM: full    Dental   Pulmonary Current Smoker,    breath sounds clear to auscultation       Cardiovascular negative cardio ROS   Rhythm:regular Rate:Normal     Neuro/Psych    GI/Hepatic GERD  ,  Endo/Other  diabetes, Type 2  Renal/GU      Musculoskeletal   Abdominal   Peds  Hematology   Anesthesia Other Findings   Reproductive/Obstetrics                             Anesthesia Physical Anesthesia Plan  ASA: II  Anesthesia Plan: Bier Block and MAC and Bier Block-LIDOCAINE ONLY   Post-op Pain Management:    Induction: Intravenous  PONV Risk Score and Plan: 1 and Propofol infusion and Treatment may vary due to age or medical condition  Airway Management Planned: Simple Face Mask  Additional Equipment:   Intra-op Plan:   Post-operative Plan:   Informed Consent: I have reviewed the patients History and Physical, chart, labs and discussed the procedure including the risks, benefits and alternatives for the proposed anesthesia with the patient or authorized representative who has indicated his/her understanding and acceptance.       Plan Discussed with: CRNA, Anesthesiologist and Surgeon  Anesthesia Plan Comments:         Anesthesia Quick Evaluation

## 2020-02-08 NOTE — H&P (Signed)
  Angela Bernard is an 74 y.o. female.   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 She underwent release of A1 pulley of the left thumb after failing conservative treatments 2 injections. She is splinted to her IP joint in extension at night for the past several weeks but this unfortunately has not made any positive difference for her. She states the injection has given her excellent relief but it continues to occasionally catch. She is not complaining of any numbness or tingling. She has had a trigger thumb release on her opposite side. The right thumb A1 pulley is injected for second time with Celestone Xylocaine.   Past Medical History:  Diagnosis Date  . Diabetes mellitus without complication (HCC)   . GERD (gastroesophageal reflux disease)   . High cholesterol     Past Surgical History:  Procedure Laterality Date  . ABDOMINAL HYSTERECTOMY    . CYST EXCISION    . MOUTH SURGERY    . TRIGGER FINGER RELEASE Left 01/13/2018   Procedure: RELEASE TRIGGER FINGER/A-1 PULLEY LEFT THUMB;  Surgeon: Cindee Salt, MD;  Location: [facility];  Service: Orthopedics;  Laterality: Left;  FAB    History reviewed. No pertinent family history. Social History:  reports that she has been smoking cigarettes. She has never used smokeless tobacco. She reports that she does not drink alcohol or use drugs.  Allergies:  Allergies  Allergen Reactions  . Ciprofloxacin Other (See Comments)    Bladder spasm    No medications prior to admission.    No results found for this or any previous visit (from the past 48 hour(s)).  No results found.   Pertinent items are noted in HPI.  Height 5\' 5"  (1.651 m), weight 68 kg.  General appearance: alert, cooperative and appears stated age Head: Normocephalic, without obvious abnormality Neck: no JVD Resp: clear to  auscultation bilaterally Cardio: regular rate and rhythm, S1, S2 normal, no murmur, click, rub or gallop GI: soft, non-tender; bowel sounds normal; no masses,  no organomegaly Extremities: catching right thumb Pulses: 2+ and symmetric Skin: Skin color, texture, turgor normal. No rashes or lesions Neurologic: Grossly normal Incision/Wound: na  Assessment/Plan Assessment:  1. Trigger finger of right thumb    Plan: She would like to proceed to have this surgically released. Pre-peri-and postoperative course are discussed along with risk complications. She is aware that there is no guarantee to the surgery the possibility of infection recurrence injury to arteries nerves tendons incomplete relief symptoms and dystrophy. She is scheduled for release A1 pulley right thumb as an outpatient under regional anesthesia.   02/08/2020, 5:17 AM

## 2020-02-08 NOTE — Transfer of Care (Signed)
Immediate Anesthesia Transfer of Care Note  Patient: Angela Bernard  Procedure(s) Performed: RELEASE TRIGGER FINGER/A-1 PULLEY (Right Hand)  Patient Location: PACU  Anesthesia Type:MAC  Level of Consciousness: sedated  Airway & Oxygen Therapy: Patient Spontanous Breathing  Post-op Assessment: Report given to RN and Post -op Vital signs reviewed and stable  Post vital signs: Reviewed and stable  Last Vitals:  Vitals Value Taken Time  BP 119/96 02/08/20 1005  Temp    Pulse 61 02/08/20 1007  Resp 11 02/08/20 1007  SpO2 99 % 02/08/20 1007  Vitals shown include unvalidated device data.  Last Pain:  Vitals:   02/08/20 0840  TempSrc: Temporal  PainSc: 0-No pain         Complications: No apparent anesthesia complications

## 2020-02-08 NOTE — Discharge Instructions (Addendum)

## 2020-02-09 ENCOUNTER — Encounter: Payer: Self-pay | Admitting: *Deleted

## 2020-02-10 ENCOUNTER — Encounter: Payer: Self-pay | Admitting: Anesthesiology

## 2020-02-10 NOTE — Anesthesia Postprocedure Evaluation (Signed)
Anesthesia Post Note  Patient: Angela Bernard  Procedure(s) Performed: RELEASE TRIGGER FINGER/A-1 PULLEY (Right Hand)     Patient location during evaluation: PACU Anesthesia Type: MAC and Bier Block Level of consciousness: awake and alert Pain management: pain level controlled Vital Signs Assessment: post-procedure vital signs reviewed and stable Respiratory status: spontaneous breathing, nonlabored ventilation, respiratory function stable and patient connected to nasal cannula oxygen Cardiovascular status: stable and blood pressure returned to baseline Postop Assessment: no apparent nausea or vomiting Anesthetic complications: no    Last Vitals:  Vitals:   02/08/20 1015 02/08/20 1030  BP: 118/62 130/64  Pulse: 60 (!) 58  Resp: (!) 8 18  Temp:  36.4 C  SpO2: 98% 98%    Last Pain:  Vitals:   02/08/20 1030  TempSrc:   PainSc: 0-No pain                 Deivi Huckins S

## 2020-04-12 DIAGNOSIS — R1013 Epigastric pain: Secondary | ICD-10-CM | POA: Diagnosis not present

## 2020-06-27 DIAGNOSIS — Z23 Encounter for immunization: Secondary | ICD-10-CM | POA: Diagnosis not present

## 2020-06-29 DIAGNOSIS — H401131 Primary open-angle glaucoma, bilateral, mild stage: Secondary | ICD-10-CM | POA: Diagnosis not present

## 2020-06-29 DIAGNOSIS — E119 Type 2 diabetes mellitus without complications: Secondary | ICD-10-CM | POA: Diagnosis not present

## 2020-07-13 DIAGNOSIS — I10 Essential (primary) hypertension: Secondary | ICD-10-CM | POA: Diagnosis not present

## 2020-07-13 DIAGNOSIS — E785 Hyperlipidemia, unspecified: Secondary | ICD-10-CM | POA: Diagnosis not present

## 2020-07-13 DIAGNOSIS — Z0000 Encounter for general adult medical examination without abnormal findings: Secondary | ICD-10-CM | POA: Diagnosis not present

## 2020-07-13 DIAGNOSIS — Z23 Encounter for immunization: Secondary | ICD-10-CM | POA: Diagnosis not present

## 2020-07-13 DIAGNOSIS — E119 Type 2 diabetes mellitus without complications: Secondary | ICD-10-CM | POA: Diagnosis not present

## 2020-08-26 DIAGNOSIS — Z1231 Encounter for screening mammogram for malignant neoplasm of breast: Secondary | ICD-10-CM | POA: Diagnosis not present

## 2021-01-11 DIAGNOSIS — H401131 Primary open-angle glaucoma, bilateral, mild stage: Secondary | ICD-10-CM | POA: Diagnosis not present

## 2021-01-11 DIAGNOSIS — H2513 Age-related nuclear cataract, bilateral: Secondary | ICD-10-CM | POA: Diagnosis not present

## 2021-01-11 DIAGNOSIS — H1045 Other chronic allergic conjunctivitis: Secondary | ICD-10-CM | POA: Diagnosis not present

## 2021-06-15 DIAGNOSIS — H401131 Primary open-angle glaucoma, bilateral, mild stage: Secondary | ICD-10-CM | POA: Diagnosis not present

## 2021-06-29 DIAGNOSIS — Z23 Encounter for immunization: Secondary | ICD-10-CM | POA: Diagnosis not present

## 2021-07-23 DIAGNOSIS — M8580 Other specified disorders of bone density and structure, unspecified site: Secondary | ICD-10-CM | POA: Diagnosis not present

## 2021-07-23 DIAGNOSIS — Z0000 Encounter for general adult medical examination without abnormal findings: Secondary | ICD-10-CM | POA: Diagnosis not present

## 2021-07-23 DIAGNOSIS — E785 Hyperlipidemia, unspecified: Secondary | ICD-10-CM | POA: Diagnosis not present

## 2021-07-23 DIAGNOSIS — H401190 Primary open-angle glaucoma, unspecified eye, stage unspecified: Secondary | ICD-10-CM | POA: Diagnosis not present

## 2021-07-23 DIAGNOSIS — M6530 Trigger finger, unspecified finger: Secondary | ICD-10-CM | POA: Diagnosis not present

## 2021-07-23 DIAGNOSIS — F17200 Nicotine dependence, unspecified, uncomplicated: Secondary | ICD-10-CM | POA: Diagnosis not present

## 2021-07-23 DIAGNOSIS — E1169 Type 2 diabetes mellitus with other specified complication: Secondary | ICD-10-CM | POA: Diagnosis not present

## 2021-07-23 DIAGNOSIS — E119 Type 2 diabetes mellitus without complications: Secondary | ICD-10-CM | POA: Diagnosis not present

## 2021-07-23 DIAGNOSIS — R1013 Epigastric pain: Secondary | ICD-10-CM | POA: Diagnosis not present

## 2021-07-26 DIAGNOSIS — Z1211 Encounter for screening for malignant neoplasm of colon: Secondary | ICD-10-CM | POA: Diagnosis not present

## 2021-08-21 ENCOUNTER — Other Ambulatory Visit: Payer: Self-pay | Admitting: Family Medicine

## 2021-08-21 DIAGNOSIS — Z122 Encounter for screening for malignant neoplasm of respiratory organs: Secondary | ICD-10-CM

## 2021-08-21 DIAGNOSIS — Z87891 Personal history of nicotine dependence: Secondary | ICD-10-CM

## 2021-08-24 ENCOUNTER — Other Ambulatory Visit: Payer: Self-pay | Admitting: Family Medicine

## 2021-08-24 DIAGNOSIS — Z780 Asymptomatic menopausal state: Secondary | ICD-10-CM

## 2021-08-24 DIAGNOSIS — Z13820 Encounter for screening for osteoporosis: Secondary | ICD-10-CM

## 2021-09-13 ENCOUNTER — Ambulatory Visit: Payer: Medicare HMO

## 2022-01-25 ENCOUNTER — Ambulatory Visit
Admission: RE | Admit: 2022-01-25 | Discharge: 2022-01-25 | Disposition: A | Discharge status: Home / Self Care | Payer: Medicare HMO | Source: Ambulatory Visit | Attending: Family Medicine | Admitting: Family Medicine

## 2022-01-25 DIAGNOSIS — Z780 Asymptomatic menopausal state: Secondary | ICD-10-CM

## 2022-01-25 DIAGNOSIS — Z13820 Encounter for screening for osteoporosis: Secondary | ICD-10-CM

## 2022-09-11 ENCOUNTER — Other Ambulatory Visit: Payer: Self-pay | Admitting: Geriatric Medicine

## 2022-09-11 DIAGNOSIS — F17200 Nicotine dependence, unspecified, uncomplicated: Secondary | ICD-10-CM

## 2023-09-12 ENCOUNTER — Other Ambulatory Visit: Payer: Self-pay | Admitting: Adult Health

## 2023-09-12 DIAGNOSIS — Z13820 Encounter for screening for osteoporosis: Secondary | ICD-10-CM

## 2024-05-04 ENCOUNTER — Other Ambulatory Visit: Payer: Medicare HMO

## 2024-06-14 ENCOUNTER — Ambulatory Visit (HOSPITAL_BASED_OUTPATIENT_CLINIC_OR_DEPARTMENT_OTHER)
Admission: RE | Admit: 2024-06-14 | Discharge: 2024-06-14 | Disposition: A | Discharge status: Home / Self Care | Source: Ambulatory Visit | Attending: Adult Health | Admitting: Adult Health

## 2024-06-14 DIAGNOSIS — M8589 Other specified disorders of bone density and structure, multiple sites: Secondary | ICD-10-CM | POA: Insufficient documentation

## 2024-06-14 DIAGNOSIS — Z780 Asymptomatic menopausal state: Secondary | ICD-10-CM | POA: Insufficient documentation

## 2024-06-14 DIAGNOSIS — Z13820 Encounter for screening for osteoporosis: Secondary | ICD-10-CM | POA: Insufficient documentation
